# Patient Record
Sex: Female | Born: 1960 | Race: White | Hispanic: No | Marital: Single | Smoking: Never smoker
Health system: Southern US, Community
[De-identification: ages and names within clinical notes are randomized; demographics above are authoritative.]

## PROBLEM LIST (undated history)

## (undated) DIAGNOSIS — D649 Anemia, unspecified: Secondary | ICD-10-CM

## (undated) DIAGNOSIS — I82409 Acute embolism and thrombosis of unspecified deep veins of unspecified lower extremity: Secondary | ICD-10-CM

## (undated) HISTORY — DX: Anemia, unspecified: D64.9

## (undated) HISTORY — DX: Acute embolism and thrombosis of unspecified deep veins of unspecified lower extremity: I82.409

---

## 1999-10-21 ENCOUNTER — Encounter: Admission: RE | Admit: 1999-10-21 | Discharge: 1999-10-21 | Payer: Self-pay | Admitting: Family Medicine

## 1999-10-21 ENCOUNTER — Encounter: Payer: Self-pay | Admitting: Family Medicine

## 2000-11-26 ENCOUNTER — Encounter: Admission: RE | Admit: 2000-11-26 | Discharge: 2000-11-26 | Payer: Self-pay | Admitting: Family Medicine

## 2000-11-26 ENCOUNTER — Encounter: Payer: Self-pay | Admitting: Family Medicine

## 2000-12-01 ENCOUNTER — Encounter: Payer: Self-pay | Admitting: Family Medicine

## 2000-12-01 ENCOUNTER — Encounter: Admission: RE | Admit: 2000-12-01 | Discharge: 2000-12-01 | Payer: Self-pay | Admitting: Family Medicine

## 2001-12-02 ENCOUNTER — Encounter: Payer: Self-pay | Admitting: Family Medicine

## 2001-12-02 ENCOUNTER — Encounter: Admission: RE | Admit: 2001-12-02 | Discharge: 2001-12-02 | Payer: Self-pay | Admitting: Family Medicine

## 2002-02-02 HISTORY — PX: GASTRIC BYPASS: SHX52

## 2002-10-02 ENCOUNTER — Ambulatory Visit (HOSPITAL_COMMUNITY): Admission: RE | Admit: 2002-10-02 | Discharge: 2002-10-02 | Payer: Self-pay | Admitting: *Deleted

## 2002-10-02 ENCOUNTER — Encounter: Admission: RE | Admit: 2002-10-02 | Discharge: 2002-12-31 | Payer: Self-pay | Admitting: *Deleted

## 2002-10-03 ENCOUNTER — Encounter: Admission: RE | Admit: 2002-10-03 | Discharge: 2002-10-20 | Payer: Self-pay | Admitting: *Deleted

## 2002-10-06 ENCOUNTER — Ambulatory Visit (HOSPITAL_COMMUNITY): Admission: RE | Admit: 2002-10-06 | Discharge: 2002-10-06 | Payer: Self-pay | Admitting: *Deleted

## 2002-10-19 ENCOUNTER — Encounter (INDEPENDENT_AMBULATORY_CARE_PROVIDER_SITE_OTHER): Payer: Self-pay | Admitting: Specialist

## 2002-10-19 ENCOUNTER — Ambulatory Visit (HOSPITAL_COMMUNITY): Admission: RE | Admit: 2002-10-19 | Discharge: 2002-10-19 | Payer: Self-pay | Admitting: Gastroenterology

## 2002-10-26 ENCOUNTER — Encounter: Admission: RE | Admit: 2002-10-26 | Discharge: 2002-10-26 | Payer: Self-pay | Admitting: *Deleted

## 2002-12-01 ENCOUNTER — Encounter: Admission: RE | Admit: 2002-12-01 | Discharge: 2002-12-01 | Payer: Self-pay | Admitting: Family Medicine

## 2003-01-04 ENCOUNTER — Encounter: Admission: RE | Admit: 2003-01-04 | Discharge: 2003-04-04 | Payer: Self-pay | Admitting: *Deleted

## 2003-01-22 ENCOUNTER — Ambulatory Visit (HOSPITAL_COMMUNITY): Admission: RE | Admit: 2003-01-22 | Discharge: 2003-01-22 | Payer: Self-pay | Admitting: Vascular Surgery

## 2003-01-23 ENCOUNTER — Inpatient Hospital Stay (HOSPITAL_COMMUNITY): Admission: RE | Admit: 2003-01-23 | Discharge: 2003-01-26 | Payer: Self-pay | Admitting: General Surgery

## 2003-04-26 ENCOUNTER — Encounter: Admission: RE | Admit: 2003-04-26 | Discharge: 2003-07-25 | Payer: Self-pay | Admitting: *Deleted

## 2003-07-26 ENCOUNTER — Encounter: Admission: RE | Admit: 2003-07-26 | Discharge: 2003-10-24 | Payer: Self-pay | Admitting: *Deleted

## 2003-11-12 ENCOUNTER — Encounter: Admission: RE | Admit: 2003-11-12 | Discharge: 2003-11-12 | Payer: Self-pay | Admitting: Family Medicine

## 2004-11-17 ENCOUNTER — Ambulatory Visit (HOSPITAL_COMMUNITY): Admission: RE | Admit: 2004-11-17 | Discharge: 2004-11-17 | Payer: Self-pay | Admitting: Family Medicine

## 2005-11-20 ENCOUNTER — Encounter: Admission: RE | Admit: 2005-11-20 | Discharge: 2005-11-20 | Payer: Self-pay | Admitting: Family Medicine

## 2006-11-22 ENCOUNTER — Encounter: Admission: RE | Admit: 2006-11-22 | Discharge: 2006-11-22 | Payer: Self-pay | Admitting: Family Medicine

## 2007-11-25 ENCOUNTER — Encounter: Admission: RE | Admit: 2007-11-25 | Discharge: 2007-11-25 | Payer: Self-pay | Admitting: Family Medicine

## 2008-02-03 HISTORY — PX: HIP SURGERY: SHX245

## 2008-12-31 ENCOUNTER — Encounter: Admission: RE | Admit: 2008-12-31 | Discharge: 2008-12-31 | Payer: Self-pay | Admitting: Family Medicine

## 2009-03-25 ENCOUNTER — Other Ambulatory Visit: Admission: RE | Admit: 2009-03-25 | Discharge: 2009-03-25 | Payer: Self-pay | Admitting: Family Medicine

## 2009-11-04 ENCOUNTER — Encounter: Admission: RE | Admit: 2009-11-04 | Discharge: 2009-11-04 | Payer: Self-pay | Admitting: Family Medicine

## 2010-02-07 ENCOUNTER — Inpatient Hospital Stay (HOSPITAL_COMMUNITY)
Admission: RE | Admit: 2010-02-07 | Discharge: 2010-02-11 | Payer: Self-pay | Source: Home / Self Care | Attending: Orthopaedic Surgery | Admitting: Orthopaedic Surgery

## 2010-02-07 LAB — TYPE AND SCREEN
ABO/RH(D): A POS
Antibody Screen: NEGATIVE

## 2010-02-07 LAB — ABO/RH: ABO/RH(D): A POS

## 2010-02-07 LAB — PROTIME-INR
INR: 1.04 (ref 0.00–1.49)
Prothrombin Time: 13.8 seconds (ref 11.6–15.2)

## 2010-02-17 LAB — CBC
HCT: 30.3 % — ABNORMAL LOW (ref 36.0–46.0)
HCT: 27.8 % — ABNORMAL LOW (ref 36.0–46.0)
HCT: 27.4 % — ABNORMAL LOW (ref 36.0–46.0)
Hemoglobin: 9.9 g/dL — ABNORMAL LOW (ref 12.0–15.0)
Hemoglobin: 9.1 g/dL — ABNORMAL LOW (ref 12.0–15.0)
Hemoglobin: 8.8 g/dL — ABNORMAL LOW (ref 12.0–15.0)
MCH: 30.9 pg (ref 26.0–34.0)
MCH: 31.2 pg (ref 26.0–34.0)
MCH: 30.4 pg (ref 26.0–34.0)
MCHC: 32.7 g/dL (ref 30.0–36.0)
MCHC: 32.7 g/dL (ref 30.0–36.0)
MCHC: 32.1 g/dL (ref 30.0–36.0)
MCV: 94.7 fL (ref 78.0–100.0)
MCV: 95.2 fL (ref 78.0–100.0)
MCV: 94.8 fL (ref 78.0–100.0)
Platelets: 189 10*3/uL (ref 150–400)
Platelets: 169 10*3/uL (ref 150–400)
Platelets: 183 10*3/uL (ref 150–400)
RBC: 3.2 MIL/uL — ABNORMAL LOW (ref 3.87–5.11)
RBC: 2.92 MIL/uL — ABNORMAL LOW (ref 3.87–5.11)
RBC: 2.89 MIL/uL — ABNORMAL LOW (ref 3.87–5.11)
RDW: 12.9 % (ref 11.5–15.5)
RDW: 12.9 % (ref 11.5–15.5)
RDW: 12.9 % (ref 11.5–15.5)
WBC: 6.7 10*3/uL (ref 4.0–10.5)
WBC: 9.5 10*3/uL (ref 4.0–10.5)
WBC: 8.5 10*3/uL (ref 4.0–10.5)

## 2010-02-17 LAB — BASIC METABOLIC PANEL
BUN: 11 mg/dL (ref 6–23)
BUN: 6 mg/dL (ref 6–23)
BUN: 6 mg/dL (ref 6–23)
CO2: 28 mEq/L (ref 19–32)
CO2: 29 mEq/L (ref 19–32)
CO2: 28 mEq/L (ref 19–32)
Calcium: 8.4 mg/dL (ref 8.4–10.5)
Calcium: 8.5 mg/dL (ref 8.4–10.5)
Calcium: 8.3 mg/dL — ABNORMAL LOW (ref 8.4–10.5)
Chloride: 104 mEq/L (ref 96–112)
Chloride: 106 mEq/L (ref 96–112)
Chloride: 104 mEq/L (ref 96–112)
Creatinine, Ser: 0.76 mg/dL (ref 0.4–1.2)
Creatinine, Ser: 0.77 mg/dL (ref 0.4–1.2)
Creatinine, Ser: 0.72 mg/dL (ref 0.4–1.2)
GFR calc Af Amer: >60 mL/min (ref >60)
GFR calc Af Amer: >60 mL/min (ref >60)
GFR calc Af Amer: >60 mL/min (ref >60)
GFR calc non Af Amer: >60 mL/min (ref >60)
GFR calc non Af Amer: >60 mL/min (ref >60)
GFR calc non Af Amer: >60 mL/min (ref >60)
Glucose, Bld: 121 mg/dL — ABNORMAL HIGH (ref 70–99)
Glucose, Bld: 114 mg/dL — ABNORMAL HIGH (ref 70–99)
Glucose, Bld: 112 mg/dL — ABNORMAL HIGH (ref 70–99)
Potassium: 4.3 mEq/L (ref 3.5–5.1)
Potassium: 4.1 mEq/L (ref 3.5–5.1)
Potassium: 3.8 mEq/L (ref 3.5–5.1)
Sodium: 137 mEq/L (ref 135–145)
Sodium: 141 mEq/L (ref 135–145)
Sodium: 138 mEq/L (ref 135–145)

## 2010-02-17 LAB — PROTIME-INR
INR: 1.22 (ref 0.00–1.49)
INR: 1.67 — ABNORMAL HIGH (ref 0.00–1.49)
INR: 1.68 — ABNORMAL HIGH (ref 0.00–1.49)
INR: 1.54 — ABNORMAL HIGH (ref 0.00–1.49)
Prothrombin Time: 15.6 seconds — ABNORMAL HIGH (ref 11.6–15.2)
Prothrombin Time: 19.9 seconds — ABNORMAL HIGH (ref 11.6–15.2)
Prothrombin Time: 20 seconds — ABNORMAL HIGH (ref 11.6–15.2)
Prothrombin Time: 18.7 seconds — ABNORMAL HIGH (ref 11.6–15.2)

## 2010-02-17 LAB — GLUCOSE, CAPILLARY: Glucose-Capillary: 113 mg/dL — ABNORMAL HIGH (ref 70–99)

## 2010-02-22 ENCOUNTER — Encounter: Payer: Self-pay | Admitting: General Surgery

## 2010-03-04 ENCOUNTER — Inpatient Hospital Stay (HOSPITAL_COMMUNITY)
Admission: EM | Admit: 2010-03-04 | Discharge: 2010-03-08 | DRG: 176 | Disposition: A | Discharge status: Home Health | Payer: 59 | Attending: Internal Medicine | Admitting: Internal Medicine

## 2010-03-04 DIAGNOSIS — R5381 Other malaise: Secondary | ICD-10-CM | POA: Diagnosis present

## 2010-03-04 DIAGNOSIS — Z9221 Personal history of antineoplastic chemotherapy: Secondary | ICD-10-CM

## 2010-03-04 DIAGNOSIS — D259 Leiomyoma of uterus, unspecified: Secondary | ICD-10-CM | POA: Diagnosis present

## 2010-03-04 DIAGNOSIS — I82409 Acute embolism and thrombosis of unspecified deep veins of unspecified lower extremity: Secondary | ICD-10-CM | POA: Diagnosis present

## 2010-03-04 DIAGNOSIS — J984 Other disorders of lung: Secondary | ICD-10-CM | POA: Diagnosis present

## 2010-03-04 DIAGNOSIS — E669 Obesity, unspecified: Secondary | ICD-10-CM | POA: Diagnosis present

## 2010-03-04 DIAGNOSIS — Z96649 Presence of unspecified artificial hip joint: Secondary | ICD-10-CM

## 2010-03-04 DIAGNOSIS — D649 Anemia, unspecified: Secondary | ICD-10-CM | POA: Diagnosis present

## 2010-03-04 DIAGNOSIS — I2699 Other pulmonary embolism without acute cor pulmonale: Principal | ICD-10-CM | POA: Diagnosis present

## 2010-03-04 DIAGNOSIS — R5383 Other fatigue: Secondary | ICD-10-CM | POA: Diagnosis present

## 2010-03-04 DIAGNOSIS — M1990 Unspecified osteoarthritis, unspecified site: Secondary | ICD-10-CM | POA: Diagnosis present

## 2010-03-04 LAB — CBC
HCT: 33.8 % — ABNORMAL LOW (ref 36.0–46.0)
MCV: 94.9 fL (ref 78.0–100.0)
Platelets: 296 10*3/uL (ref 150–400)
RBC: 3.56 MIL/uL — ABNORMAL LOW (ref 3.87–5.11)
WBC: 9.9 10*3/uL (ref 4.0–10.5)

## 2010-03-04 LAB — DIFFERENTIAL
Lymphocytes Relative: 7 % — ABNORMAL LOW (ref 12–46)
Lymphs Abs: 0.7 10*3/uL (ref 0.7–4.0)
Neutrophils Relative %: 86 % — ABNORMAL HIGH (ref 43–77)

## 2010-03-04 LAB — BASIC METABOLIC PANEL
GFR calc non Af Amer: 49 mL/min — ABNORMAL LOW (ref >60)
Potassium: 4.7 mEq/L (ref 3.5–5.1)
Sodium: 137 mEq/L (ref 135–145)

## 2010-03-04 LAB — OCCULT BLOOD, POC DEVICE: Fecal Occult Bld: NEGATIVE

## 2010-03-05 DIAGNOSIS — I2699 Other pulmonary embolism without acute cor pulmonale: Secondary | ICD-10-CM

## 2010-03-05 LAB — COMPREHENSIVE METABOLIC PANEL
ALT: 13 U/L (ref 0–35)
AST: 15 U/L (ref 0–37)
Albumin: 3.3 g/dL — ABNORMAL LOW (ref 3.5–5.2)
Alkaline Phosphatase: 128 U/L — ABNORMAL HIGH (ref 39–117)
GFR calc Af Amer: >60 mL/min (ref >60)
Glucose, Bld: 111 mg/dL — ABNORMAL HIGH (ref 70–99)
Potassium: 4.5 mEq/L (ref 3.5–5.1)
Sodium: 140 mEq/L (ref 135–145)
Total Protein: 6.3 g/dL (ref 6.0–8.3)

## 2010-03-05 LAB — LUPUS ANTICOAGULANT PANEL
Lupus Anticoagulant: NOT DETECTED
PTT Lupus Anticoagulant: 35 secs (ref 30.0–45.6)

## 2010-03-05 LAB — CBC
Hemoglobin: 9.3 g/dL — ABNORMAL LOW (ref 12.0–15.0)
MCH: 30.2 pg (ref 26.0–34.0)
RBC: 3.08 MIL/uL — ABNORMAL LOW (ref 3.87–5.11)
WBC: 8.2 10*3/uL (ref 4.0–10.5)

## 2010-03-05 LAB — PROTEIN C ACTIVITY: Protein C Activity: 158 % — ABNORMAL HIGH (ref 75–133)

## 2010-03-05 LAB — CARDIOLIPIN ANTIBODIES, IGG, IGM, IGA
Anticardiolipin IgA: 1 APL U/mL — ABNORMAL LOW (ref <22)
Anticardiolipin IgG: 3 GPL U/mL — ABNORMAL LOW (ref <23)
Anticardiolipin IgM: 2 MPL U/mL — ABNORMAL LOW (ref <11)

## 2010-03-05 LAB — DIFFERENTIAL
Basophils Relative: 0 % (ref 0–1)
Lymphs Abs: 1.9 10*3/uL (ref 0.7–4.0)
Monocytes Relative: 13 % — ABNORMAL HIGH (ref 3–12)
Neutro Abs: 5.1 10*3/uL (ref 1.7–7.7)
Neutrophils Relative %: 63 % (ref 43–77)

## 2010-03-05 LAB — BETA-2-GLYCOPROTEIN I ABS, IGG/M/A: Beta-2-Glycoprotein I IgA: 3 A Units (ref <20)

## 2010-03-05 LAB — ANTITHROMBIN III: AntiThromb III Func: 131 % — ABNORMAL HIGH (ref 76–126)

## 2010-03-05 LAB — PROTEIN S ACTIVITY: Protein S Activity: 70 % (ref 69–129)

## 2010-03-05 LAB — PROTIME-INR
INR: 1.23 (ref 0.00–1.49)
Prothrombin Time: 15.7 seconds — ABNORMAL HIGH (ref 11.6–15.2)

## 2010-03-06 LAB — PROTIME-INR: Prothrombin Time: 16 seconds — ABNORMAL HIGH (ref 11.6–15.2)

## 2010-03-07 LAB — CBC
MCH: 29.9 pg (ref 26.0–34.0)
MCHC: 32 g/dL (ref 30.0–36.0)
MCV: 93.4 fL (ref 78.0–100.0)
Platelets: 183 10*3/uL (ref 150–400)
RDW: 14.1 % (ref 11.5–15.5)

## 2010-03-07 LAB — BASIC METABOLIC PANEL WITH GFR
BUN: 10 mg/dL (ref 6–23)
CO2: 26 meq/L (ref 19–32)
Calcium: 8.9 mg/dL (ref 8.4–10.5)
Chloride: 103 meq/L (ref 96–112)
Creatinine, Ser: 0.73 mg/dL (ref 0.4–1.2)
GFR calc non Af Amer: >60 mL/min
Glucose, Bld: 118 mg/dL — ABNORMAL HIGH (ref 70–99)
Potassium: 4.1 meq/L (ref 3.5–5.1)
Sodium: 137 meq/L (ref 135–145)

## 2010-03-07 LAB — PROTIME-INR
INR: 1.34 (ref 0.00–1.49)
Prothrombin Time: 16.8 s — ABNORMAL HIGH (ref 11.6–15.2)

## 2010-03-08 LAB — PROTIME-INR: INR: 1.52 — ABNORMAL HIGH (ref 0.00–1.49)

## 2010-03-10 LAB — FACTOR 5 LEIDEN

## 2010-03-11 NOTE — Discharge Summary (Signed)
NAME:  Rachel Yoder, Rachel Yoder              ACCOUNT NO.:  [id number]  MEDICAL RECORD NO.:  [id number]           PATIENT TYPE:  I  LOCATION:  1444                         FACILITY:  [facility]  PHYSICIAN:  Hillery Aldo, M.D.   DATE OF BIRTH:  [birth year]  DATE OF ADMISSION:  03/04/2010 DATE OF DISCHARGE:  03/08/2010                              DISCHARGE SUMMARY   PRIMARY CARE PHYSICIAN:  Duncan Dull, M.D.  DISCHARGE DIAGNOSES: 1. Subsegmental pulmonary embolus of the right lower lobe. 2. Deep vein thrombosis of the bilateral lower extremities. 3. Normocytic anemia, likely secondary to recent operative losses. 4. Generalized weakness. 5. Obesity: 6. Recent left total hip arthroplasty, on February 07, 2010. 7. Three millimeter right lower lobe subpleural nodule.  DISCHARGE MEDICATIONS: 1. Lovenox 140 mg subcutaneously b.i.d. 2. Warfarin 10 mg p.o. daily or as directed by PCP. 3. Calcium chews 500 mg OTC p.o. b.i.d. 4. Fiber tab 1 tablet OTC b.i.d. 5. Iron 27 mg OTC 1 tablet p.o. every other day. 6. Methocarbamol 500 mg p.o. q.6 h. p.r.n. muscle spasms. 7. Multivitamin 1 tablet p.o. daily. 8. Percocet 5/325 mg 1-2 tablets p.o. q.8-12 h. p.r.n. pain. 9. Vitamin B12 500 mcg p.o. every other day. 10.Vitamin D2 50,000 units 1 capsule p.o. every Wednesday.  Note:  The patient was instructed to discontinue aspirin while on Coumadin.  CONSULTATIONS:  None.  BRIEF ADMISSION HISTORY OF PRESENT ILLNESS:  The patient is a 50 year old female who recently underwent a left total hip arthroplasty by Dr. Doneen Poisson, on February 07, 2010.  She was placed on postoperative Coumadin and 1 week prior to presentation, was switched to aspirin alone for DVT prophylaxis.  She subsequently presented to the hospital with swelling of the lower extremities and complaints of dizziness and dyspnea.  Upon initial evaluation in the emergency department, a CT scan of the chest confirmed acute pulmonary  embolism and she was referred to the hospitalist service for inpatient evaluation.  For the full details, please see the dictated report done by Dr. Selena Batten.  PROCEDURES AND DIAGNOSTIC STUDIES: 1. CT angiography of the chest with contrast on March 04, 2010,     showed subsegmental embolus in the right lower lobe, which could be     acute with clot burden was felt to be small.  Three millimeter     right lower lobe subpleural nodule highly like to be benign, but     the patient was felt to be a high risk for bronchogenic carcinoma,     followup CT scan at 1-year recommended. 2. CT scan of the pelvis with contrast on March 04, 2010, showed a     suspected uterine fibroid.  No compelling evidence of pelvic DVT.     Early postoperative findings of the left hip without fracture or     complicating feature identified. 3. Lower-extremity Dopplers performed on March 05, 2010, showed     bilateral lower extremity DVT.  No evidence of Baker cyst.  DISCHARGE LABORATORY VALUES:  PT was 18.5 with an INR of 1.52.  Sodium was 137, potassium 4.1, chloride 103, bicarbonate 26, BUN 10, creatinine  0.73, glucose 118, calcium 8.9.  White blood cell count was 8.3, hemoglobin 9.1, hematocrit 28.4, platelets 183.  Hypercoagulable profile was obtained, but these findings are not considered accurate since Lovenox had been started.  Please see the chart for these results, but no obvious hypercoagulable state identified.  HOSPITAL COURSE BY PROBLEM: 1. PE/DVT:  This was a provoked event, but given that it is the     patient's second episode of venous thromboembolic disease, she may     be considered at sufficient high risk to warrant lifelong Coumadin.     This point, would treat her for 6 months and reevaluate with a     hypercoagulable panel while off all anticoagulation.  The patient's     clot burden was small and she had minimal respiratory symptoms     while in the hospital.  Because of the large clot  burden of the     lower extremities, the patient was put on restricted mobility and     monitored closely in the hospital on telemetry.  This felt that the     clots are likely stable now that she has been on therapeutic     anticoagulation with both Lovenox and Coumadin for 4 days.  The     patient's INR is still not therapeutic and therefore she will be     going home with Lovenox in addition to the Coumadin.  Home health     nurse will be sent to her home to draw her daily PT/INR and to fax     these results to her primary care physician for further dosage     adjustment of her Coumadin. 2. Normocytic anemia:  The patient's fecal occult blood testing was     negative.  Her preoperative hemoglobin was 13.3 so was felt that     her drop was due to operative losses from her total hip replacement     done on February 07, 2010.  We did place her on a prescription dose     iron, but she had a GI intolerance to this.  We will simply have     her resume her over-the-counter iron supplement at discharge. 3. Generalized weakness:  The patient did have generalized weakness     related to her lower extremity swelling, which delayed her     discharge because she lives alone.  This point, the patient is     ambulating adequately and has sufficient support at home to be     safely discharged. 4. Three millimeter right lower lobe subpleural nodule:  The patient     is not felt to be at high risk for bronchogenic carcinoma.  She     does not smoke.  She is not have a family history of lung cancer.  DISPOSITION:  The patient is medically stable and be discharged home.  CONDITION ON DISCHARGE:  Improved.  Time spent coordinating care for discharge and discharge instructions including face-to-face time equals 35 minutes.     Hillery Aldo, M.D.     CR/MEDQ  D:  03/08/2010  T:  03/09/2010  Job:  [id number]  cc:   Duncan Dull, M.D. Fax: [phone]  Electronically Signed by Hillery Aldo M.D.  on 03/11/2010 06:49:20 PM

## 2010-03-17 ENCOUNTER — Ambulatory Visit (HOSPITAL_COMMUNITY)
Admission: RE | Admit: 2010-03-17 | Discharge: 2010-03-17 | Disposition: A | Discharge status: Home / Self Care | Payer: 59 | Source: Ambulatory Visit | Attending: Family Medicine | Admitting: Family Medicine

## 2010-03-17 DIAGNOSIS — M7989 Other specified soft tissue disorders: Secondary | ICD-10-CM | POA: Insufficient documentation

## 2010-03-17 DIAGNOSIS — M79609 Pain in unspecified limb: Secondary | ICD-10-CM

## 2010-03-17 DIAGNOSIS — I824Y9 Acute embolism and thrombosis of unspecified deep veins of unspecified proximal lower extremity: Secondary | ICD-10-CM | POA: Insufficient documentation

## 2010-03-17 DIAGNOSIS — I82819 Embolism and thrombosis of superficial veins of unspecified lower extremities: Secondary | ICD-10-CM | POA: Insufficient documentation

## 2010-03-18 ENCOUNTER — Other Ambulatory Visit: Payer: Self-pay | Admitting: Vascular Surgery

## 2010-03-18 ENCOUNTER — Inpatient Hospital Stay (HOSPITAL_COMMUNITY)
Admission: AD | Admit: 2010-03-18 | Discharge: 2010-03-25 | DRG: 254 | Disposition: A | Discharge status: Home Health | Payer: 59 | Source: Ambulatory Visit | Attending: Vascular Surgery | Admitting: Vascular Surgery

## 2010-03-18 ENCOUNTER — Encounter (INDEPENDENT_AMBULATORY_CARE_PROVIDER_SITE_OTHER): Payer: 59 | Admitting: Vascular Surgery

## 2010-03-18 ENCOUNTER — Ambulatory Visit (HOSPITAL_COMMUNITY)
Admission: RE | Admit: 2010-03-18 | Discharge: 2010-03-18 | Disposition: A | Discharge status: Home / Self Care | Payer: 59 | Source: Ambulatory Visit | Attending: Vascular Surgery | Admitting: Vascular Surgery

## 2010-03-18 DIAGNOSIS — Z7901 Long term (current) use of anticoagulants: Secondary | ICD-10-CM

## 2010-03-18 DIAGNOSIS — Y838 Other surgical procedures as the cause of abnormal reaction of the patient, or of later complication, without mention of misadventure at the time of the procedure: Secondary | ICD-10-CM | POA: Insufficient documentation

## 2010-03-18 DIAGNOSIS — T82898A Other specified complication of vascular prosthetic devices, implants and grafts, initial encounter: Secondary | ICD-10-CM | POA: Insufficient documentation

## 2010-03-18 DIAGNOSIS — Z01812 Encounter for preprocedural laboratory examination: Secondary | ICD-10-CM

## 2010-03-18 DIAGNOSIS — I82409 Acute embolism and thrombosis of unspecified deep veins of unspecified lower extremity: Secondary | ICD-10-CM

## 2010-03-18 DIAGNOSIS — Z01818 Encounter for other preprocedural examination: Secondary | ICD-10-CM

## 2010-03-18 DIAGNOSIS — I824Z9 Acute embolism and thrombosis of unspecified deep veins of unspecified distal lower extremity: Secondary | ICD-10-CM

## 2010-03-18 DIAGNOSIS — M79609 Pain in unspecified limb: Secondary | ICD-10-CM | POA: Insufficient documentation

## 2010-03-18 DIAGNOSIS — D649 Anemia, unspecified: Secondary | ICD-10-CM | POA: Diagnosis present

## 2010-03-18 DIAGNOSIS — Z96649 Presence of unspecified artificial hip joint: Secondary | ICD-10-CM

## 2010-03-18 LAB — CBC
HCT: 28.7 % — ABNORMAL LOW (ref 36.0–46.0)
Platelets: 439 10*3/uL — ABNORMAL HIGH (ref 150–400)
Platelets: 325 10*3/uL (ref 150–400)
RBC: 3.12 MIL/uL — ABNORMAL LOW (ref 3.87–5.11)
RBC: 2.39 MIL/uL — ABNORMAL LOW (ref 3.87–5.11)
RDW: 14.5 % (ref 11.5–15.5)
WBC: 13 10*3/uL — ABNORMAL HIGH (ref 4.0–10.5)
WBC: 11.5 10*3/uL — ABNORMAL HIGH (ref 4.0–10.5)

## 2010-03-18 LAB — BASIC METABOLIC PANEL
CO2: 24 mEq/L (ref 19–32)
Chloride: 106 mEq/L (ref 96–112)
Creatinine, Ser: 0.78 mg/dL (ref 0.4–1.2)
GFR calc Af Amer: >60 mL/min (ref >60)
Sodium: 137 mEq/L (ref 135–145)

## 2010-03-18 LAB — POCT I-STAT, CHEM 8
BUN: 9 mg/dL (ref 6–23)
Hemoglobin: 9.5 g/dL — ABNORMAL LOW (ref 12.0–15.0)
Potassium: 4 mEq/L (ref 3.5–5.1)
Sodium: 137 mEq/L (ref 135–145)
TCO2: 26 mmol/L (ref 0–100)

## 2010-03-18 LAB — PROTIME-INR: INR: 2.68 — ABNORMAL HIGH (ref 0.00–1.49)

## 2010-03-18 LAB — FIBRINOGEN: Fibrinogen: 716 mg/dL — ABNORMAL HIGH (ref 204–475)

## 2010-03-18 MED ORDER — IOHEXOL 300 MG/ML  SOLN
150.0000 mL | Freq: Once | INTRAMUSCULAR | Status: AC | PRN
  Administered 2010-03-18: 70 mL via INTRAVENOUS

## 2010-03-18 NOTE — Consult Note (Signed)
NEW PATIENT CONSULTATION  Rachel Yoder, Rachel Yoder DOB:  [birth year]                                       03/18/2010 NFAOZ#:[id number]  Patient was admitted to [facility] today for treatment of her extensive left leg DVT.  She has a very complex history.  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  She did have a prior history of bariatric surgery in 2004 and had an inferior vena cava filter placement several days prior to this surgery due to her risk for DVT. This was placed by Dr. Cari Caraway in 2004, and she had a trapeze filter placed.  Recently she underwent left hip surgery on 02/07/2010. She had a complication of a DVT following this.  She had a CT angiogram when she was having some shortness of breath after presenting to the emergency department on January 31.  This showed a subsegmental embolus in her right lower lobe, could be acute or chronic.  She also underwent a venous duplex at that time which showed DVT in her left and right leg, more so on the left leg.  She was continued on Coumadin therapy after a several-day hospitalization from 01/31 to 02/04.  Over the past weekend, she developed increasing swelling and tightness over her left leg and saw Dr. Kevan Ny in her office and had a repeat ultrasound ordered.  This showed extension of her clot, now with a more dilated left femoral vein and clot extending as far proximally as possible.  She is seeing me for further discussion.  She did undergo imaging while in the hospital, and I have reviewed these.  She had a CT scan of her chest and pelvis. Unfortunately, she did not have a CT scan of her abdomen; therefore, evaluation of the vena caval filter was impossible.  PAST MEDICAL HISTORY:  Significant for recent hip replacement, gastric bypass surgery, back surgery, and bunion surgery on her feet.  SOCIAL HISTORY:  She is single.  She has no children.  She works in [employer].  She does not  drink or smoke cigarettes.  FAMILY HISTORY:  Significant for venous varicosities in her mother.  REVIEW OF SYSTEMS:  Positive for weight gain up to 310 pounds.  She is 6 feet 3 inches tall.  She has a loss of appetite and fever. VASCULAR:  Prior DVT. PULMONARY:  Shortness of breath.  Review of systems otherwise negative.  PHYSICAL EXAMINATION:  A well-developed white female in no acute distress.  Blood pressure is 99/66, heart rate 103, respirations 16. Her chest is clear bilaterally.  Her abdomen is obese.  Her left leg is noted for a well-healed incision over her anterior hip.  She does have palpable superficial thrombophlebitis in the veins over her anterior thigh.  She does have marked swelling in her entire left leg versus her right.  Neurologic shows no focal weakness or paresthesias.  Skin shows just erythema over her left anterior thigh.  IMPRESSION:  Propagation of clot with marked swelling in her left leg. I do not see any evidence of phlegmasia or threat and venous gangrene. I discussed this with the patient and have recommended that she undergo consideration for thrombolysis due to the marked swelling.  I explained that this hopefully would reduce her swelling in the short term and also reduce her risk for progressive venous hypertension  changes in the long- term.  I discussed this with Dr. Valentina Gu, interventional radiology at [facility].  She will go from our office this morning to be admitted at [facility] for attempted thrombolysis.  I did explain that at some point she will need interrogation of her vena caval filter with the question of her recent pulmonary embolus.    Larina Earthly, M.D.  TFE/MEDQ  D:  03/18/2010  T:  03/18/2010  Job:  [id number]  cc:   Duncan Dull, M.D. Vanita Panda. Magnus Ivan, M.D.

## 2010-03-19 ENCOUNTER — Inpatient Hospital Stay (HOSPITAL_COMMUNITY): Payer: 59

## 2010-03-19 ENCOUNTER — Encounter (HOSPITAL_COMMUNITY): Payer: Self-pay | Admitting: Radiology

## 2010-03-19 LAB — CBC
HCT: 25.8 % — ABNORMAL LOW (ref 36.0–46.0)
Hemoglobin: 8 g/dL — ABNORMAL LOW (ref 12.0–15.0)
MCH: 28.8 pg (ref 26.0–34.0)
MCHC: 31.4 g/dL (ref 30.0–36.0)
MCV: 91.5 fL (ref 78.0–100.0)
Platelets: 364 10*3/uL (ref 150–400)
RBC: 2.82 MIL/uL — ABNORMAL LOW (ref 3.87–5.11)
WBC: 13.7 10*3/uL — ABNORMAL HIGH (ref 4.0–10.5)

## 2010-03-19 LAB — URINALYSIS, ROUTINE W REFLEX MICROSCOPIC
Leukocytes, UA: NEGATIVE
Protein, ur: 100 mg/dL — AB
Urobilinogen, UA: 1 mg/dL (ref 0.0–1.0)

## 2010-03-19 LAB — CREATININE, SERUM: GFR calc Af Amer: >60 mL/min (ref >60)

## 2010-03-19 LAB — HEPARIN LEVEL (UNFRACTIONATED): Heparin Unfractionated: 1.03 IU/mL — ABNORMAL HIGH (ref 0.30–0.70)

## 2010-03-19 LAB — FIBRINOGEN: Fibrinogen: 635 mg/dL — ABNORMAL HIGH (ref 204–475)

## 2010-03-19 LAB — URINE MICROSCOPIC-ADD ON

## 2010-03-19 MED ORDER — IOHEXOL 300 MG/ML  SOLN
100.0000 mL | Freq: Once | INTRAMUSCULAR | Status: AC | PRN
  Administered 2010-03-19: 100 mL via INTRAVENOUS

## 2010-03-20 ENCOUNTER — Inpatient Hospital Stay (HOSPITAL_COMMUNITY): Payer: 59

## 2010-03-20 DIAGNOSIS — I824Z9 Acute embolism and thrombosis of unspecified deep veins of unspecified distal lower extremity: Secondary | ICD-10-CM

## 2010-03-20 LAB — CBC
HCT: 23.9 % — ABNORMAL LOW (ref 36.0–46.0)
Hemoglobin: 7.5 g/dL — ABNORMAL LOW (ref 12.0–15.0)
MCH: 29.8 pg (ref 26.0–34.0)
MCH: 28.6 pg (ref 26.0–34.0)
MCHC: 32.3 g/dL (ref 30.0–36.0)
MCHC: 31.4 g/dL (ref 30.0–36.0)
MCV: 92.2 fL (ref 78.0–100.0)
Platelets: 294 10*3/uL (ref 150–400)
RBC: 2.55 MIL/uL — ABNORMAL LOW (ref 3.87–5.11)
RDW: 14.8 % (ref 11.5–15.5)

## 2010-03-20 LAB — GLUCOSE, CAPILLARY: Glucose-Capillary: 150 mg/dL — ABNORMAL HIGH (ref 70–99)

## 2010-03-20 MED ORDER — IOHEXOL 300 MG/ML  SOLN: 110.0000 mL | Freq: Once | INTRAMUSCULAR | Status: AC | PRN

## 2010-03-21 DIAGNOSIS — I824Z9 Acute embolism and thrombosis of unspecified deep veins of unspecified distal lower extremity: Secondary | ICD-10-CM

## 2010-03-21 LAB — CBC
HCT: 23.1 % — ABNORMAL LOW (ref 36.0–46.0)
Hemoglobin: 7.3 g/dL — ABNORMAL LOW (ref 12.0–15.0)
MCV: 89.5 fL (ref 78.0–100.0)
WBC: 7.4 10*3/uL (ref 4.0–10.5)

## 2010-03-21 LAB — HEPARIN LEVEL (UNFRACTIONATED)
Heparin Unfractionated: <0.1 IU/mL — ABNORMAL LOW (ref 0.30–0.70)
Heparin Unfractionated: 0.14 IU/mL — ABNORMAL LOW (ref 0.30–0.70)

## 2010-03-22 DIAGNOSIS — I824Z9 Acute embolism and thrombosis of unspecified deep veins of unspecified distal lower extremity: Secondary | ICD-10-CM

## 2010-03-22 LAB — CBC
HCT: 23.5 % — ABNORMAL LOW (ref 36.0–46.0)
Hemoglobin: 7.5 g/dL — ABNORMAL LOW (ref 12.0–15.0)
RBC: 2.59 MIL/uL — ABNORMAL LOW (ref 3.87–5.11)
WBC: 6 10*3/uL (ref 4.0–10.5)

## 2010-03-22 LAB — PROTIME-INR
INR: 2.44 — ABNORMAL HIGH (ref 0.00–1.49)
Prothrombin Time: 26.6 seconds — ABNORMAL HIGH (ref 11.6–15.2)

## 2010-03-22 LAB — HEPARIN LEVEL (UNFRACTIONATED)
Heparin Unfractionated: 0.21 IU/mL — ABNORMAL LOW (ref 0.30–0.70)
Heparin Unfractionated: 0.39 IU/mL (ref 0.30–0.70)

## 2010-03-23 DIAGNOSIS — I824Z9 Acute embolism and thrombosis of unspecified deep veins of unspecified distal lower extremity: Secondary | ICD-10-CM

## 2010-03-23 LAB — CROSSMATCH: Unit division: 0

## 2010-03-23 LAB — CBC
MCH: 28.1 pg (ref 26.0–34.0)
MCHC: 30.5 g/dL (ref 30.0–36.0)
MCV: 92.2 fL (ref 78.0–100.0)
Platelets: 446 10*3/uL — ABNORMAL HIGH (ref 150–400)
RBC: 2.7 MIL/uL — ABNORMAL LOW (ref 3.87–5.11)
RDW: 14.9 % (ref 11.5–15.5)

## 2010-03-23 LAB — HEPARIN LEVEL (UNFRACTIONATED): Heparin Unfractionated: 0.45 IU/mL (ref 0.30–0.70)

## 2010-03-24 DIAGNOSIS — I824Z9 Acute embolism and thrombosis of unspecified deep veins of unspecified distal lower extremity: Secondary | ICD-10-CM

## 2010-03-24 LAB — CBC
HCT: 28.5 % — ABNORMAL LOW (ref 36.0–46.0)
MCHC: 30.5 g/dL (ref 30.0–36.0)
Platelets: 526 10*3/uL — ABNORMAL HIGH (ref 150–400)
RDW: 15.1 % (ref 11.5–15.5)

## 2010-03-24 LAB — PROTIME-INR: Prothrombin Time: 30.2 seconds — ABNORMAL HIGH (ref 11.6–15.2)

## 2010-03-25 DIAGNOSIS — I824Z9 Acute embolism and thrombosis of unspecified deep veins of unspecified distal lower extremity: Secondary | ICD-10-CM

## 2010-03-25 LAB — CBC
MCH: 29 pg (ref 26.0–34.0)
Platelets: 435 10*3/uL — ABNORMAL HIGH (ref 150–400)
RBC: 2.72 MIL/uL — ABNORMAL LOW (ref 3.87–5.11)
WBC: 6.8 10*3/uL (ref 4.0–10.5)

## 2010-03-25 LAB — HEPARIN LEVEL (UNFRACTIONATED): Heparin Unfractionated: 0.66 IU/mL (ref 0.30–0.70)

## 2010-03-25 LAB — PROTIME-INR: INR: 3.3 — ABNORMAL HIGH (ref 0.00–1.49)

## 2010-03-26 NOTE — H&P (Signed)
NAME:  Rachel Yoder, Rachel Yoder NO.:  [id number]  MEDICAL RECORD NO.:  [id number]          PATIENT TYPE:  INP  LOCATION:  0102                         FACILITY:  [facility]  PHYSICIAN:  Massie Maroon, MD        DATE OF BIRTH:  April 07, 1960  DATE OF ADMISSION:  03/04/2010 DATE OF DISCHARGE:                             HISTORY & PHYSICAL   PRIMARY CARE PHYSICIAN:  Dr. Sima Matas.  CHIEF COMPLAINT:  "I am dizzy and short of breath."  HISTORY OF PRESENT ILLNESS:  [age]-year-old female with a history of prior right lower extremity DVT, apparently, recently had left total hip arthroplasty through a direct anterior approach on February 07, 2010. She apparently went off Coumadin last week and was placed on aspirin alone for DVT prophylaxis.  Today, when she woke, she felt dizzy and short of breath.  Patient denied any fever, chills, chest pain, palpitations, nausea, vomiting, bright red blood per rectum or black stool.  She presented to the ED.  A CT angio chest showed subsegmental embolus in the right lower lung, could be acute, but the clot burden is small.  There was also a 3 mm right lower lung subpleural nodule highly likely to be benign, but if the patient is at high risk, a followup CT chest 1 year is recommended.  Patient will be admitted for pulmonary embolus.  Patient was started on Lovenox before I arrived and therefore we will send off a hypercoagulable panel, but this will obviously be affected by her receiving Lovenox already.  Patient will be admitted for PE.  PAST MEDICAL HISTORY: 1. Right lower extremity DVT. 2. Uterine fibroids.  PAST SURGICAL HISTORY: 1. On October 19, 2002 EGD with biopsy - gastritis with three small     erosions present in the antrum, which are suspected to be secondary     to NSAID use and the few small gastric polyps, which were benign     fundic gland polyps. 2. On January 30, 2003, placement of IVC filter. 3. On January 23, 2003,  EGD. 4. On January 23, 2003, laparoscopic Roux-Y gastric bypass by Dr.     Glenna Fellows. 5. On February 07, 2010, left total hip arthroplasty with a direct     anterior approach by Dr. Doneen Poisson. 6. Bunionectomy 7. Back surgery in [place].  SOCIAL HISTORY:  Patient does not smoke.  She is single.  She has three cats.  She occasionally drinks.  FAMILY HISTORY:  Positive for colon cancer in her mother, who is alive at age 13.  Her father is alive with a history of lymphoma at age 64.  ALLERGIES:  No known drug allergies.  MEDICATIONS: 1. Methocarbamol 5 mg p.o. q.6h. p.r.n. muscle spasms. 2. Percocet 5/325 one to two p.o. q.8-12h. p.r.n. 3. Fiber tab OTC one p.o. b.i.d. 4. Vitamin D 50,000 international units one p.o. every week. 5. Iron 27 mg OTC one p.o. daily. 6. Vitamin B12 500 mcg p.o. daily. 7. Multivitamin one p.o. daily. 8. Calcium chews 500 mg one p.o. b.i.d. 9. Enteric-coated aspirin 325 mg p.o. daily.  REVIEW OF SYSTEMS:  Negative  for all 10 organ systems except for pertinent positives stated above.PHYSICAL EXAMINATION:  VITAL SIGNS:  Temperature 98.4, heart rate 70, blood pressure 114/71, pulse ox is 98% on room air. HEENT:  Anicteric. NECK:  No JVD. HEART:  Regular rate and rhythm.  S1, S2.  No murmurs, gallops or rubs. LUNGS:  Clear to auscultation bilaterally. ABDOMEN:  Soft, nontender, morbidly obese.  Positive bowel sounds. EXTREMITIES:  No cyanosis, clubbing or edema. SKIN:  Positive varicose veins.  Negative Homans' sign. LYMPH NODES:  No adenopathy. NEUROLOGIC EXAMINATION:  Nonfocal.  Cranial nerves II through XII intact.  Reflexes 2+, symmetric, diffuse with downgoing toes bilaterally, motor strength 5/5 in all four extremities, pinprick intact.  LABORATORY DATA:  Hemoccult stool negative.  Sodium 137, potassium 4.7, BUN 19, creatinine 1.16.  WBC 9.9, hemoglobin 10.7, platelet count 296.  DIAGNOSTIC STUDIES:  CTA chest shows subsegmental  embolus in the right lower lung and a 3 mm right lower lung subpleural nodule highly likely to be benign; however, patient is at high risk for bronchogenic carcinoma.  Repeat CT scan in a.m.  ASSESSMENT AND PLAN: 1. Pulmonary embolus:  Check lower extremity ultrasound to rule out     deep venous thrombosis.  It is good that she has an inferior vena     cava filter in place, so we have to stop anticoagulation due to     anemia.  Patient will be started on Lovenox 1 mg/kg subcutaneously     b.i.d. and transitioned to Coumadin.  Hypercoagulable panel is sent     since this is her second deep venous thrombosis.  She did have     reasons for her first deep venous thrombosis, which was the     bunionectomy and now left total hip arthroplasty as a contributing     factor to her second deep venous thrombosis.  Patient denies any     family history of blood clots. 2. The 3 mm right lower lung subpleural nodule:  Patient was told     about this and told to possibly have repeat computed tomography     chest in 1 year. 3. Osteoarthritis, left total hip arthroplasty through direct anterior     approach on February 07, 2010, physical therapy, occupational     therapy, Dilaudid, Percocet as needed. 4. Anemia:  Continue iron.     Massie Maroon, MD     JYK/MEDQ  D:  03/04/2010  T:  03/04/2010  Job:  [id number]  cc:   Vanita Panda. Magnus Ivan, M.D. Fax: [phone]  Lupita Leash M.D. [facility]  Electronically Signed by Pearson Grippe MD on 03/26/2010 01:28:26 PM

## 2010-03-28 ENCOUNTER — Other Ambulatory Visit: Payer: Self-pay | Admitting: Family Medicine

## 2010-03-28 ENCOUNTER — Other Ambulatory Visit (HOSPITAL_COMMUNITY)
Admission: RE | Admit: 2010-03-28 | Discharge: 2010-03-28 | Disposition: A | Discharge status: Home / Self Care | Payer: 59 | Source: Ambulatory Visit | Attending: Family Medicine | Admitting: Family Medicine

## 2010-03-28 DIAGNOSIS — Z124 Encounter for screening for malignant neoplasm of cervix: Secondary | ICD-10-CM | POA: Insufficient documentation

## 2010-03-30 ENCOUNTER — Emergency Department (HOSPITAL_BASED_OUTPATIENT_CLINIC_OR_DEPARTMENT_OTHER)
Admission: EM | Admit: 2010-03-30 | Discharge: 2010-03-30 | Disposition: A | Discharge status: Home / Self Care | Payer: 59 | Attending: Emergency Medicine | Admitting: Emergency Medicine

## 2010-03-30 ENCOUNTER — Emergency Department (INDEPENDENT_AMBULATORY_CARE_PROVIDER_SITE_OTHER): Payer: 59

## 2010-03-30 DIAGNOSIS — Z86718 Personal history of other venous thrombosis and embolism: Secondary | ICD-10-CM | POA: Insufficient documentation

## 2010-03-30 DIAGNOSIS — M25519 Pain in unspecified shoulder: Secondary | ICD-10-CM | POA: Insufficient documentation

## 2010-03-30 DIAGNOSIS — M542 Cervicalgia: Secondary | ICD-10-CM

## 2010-03-30 DIAGNOSIS — R209 Unspecified disturbances of skin sensation: Secondary | ICD-10-CM | POA: Insufficient documentation

## 2010-03-30 DIAGNOSIS — R0602 Shortness of breath: Secondary | ICD-10-CM | POA: Insufficient documentation

## 2010-03-30 LAB — BASIC METABOLIC PANEL
BUN: 11 mg/dL (ref 6–23)
Calcium: 9.4 mg/dL (ref 8.4–10.5)
Creatinine, Ser: 0.7 mg/dL (ref 0.4–1.2)
GFR calc non Af Amer: >60 mL/min (ref >60)
Glucose, Bld: 112 mg/dL — ABNORMAL HIGH (ref 70–99)
Sodium: 142 mEq/L (ref 135–145)

## 2010-03-30 LAB — DIFFERENTIAL
Basophils Relative: 0 % (ref 0–1)
Eosinophils Absolute: 0.2 10*3/uL (ref 0.0–0.7)
Eosinophils Relative: 2 % (ref 0–5)
Monocytes Relative: 10 % (ref 3–12)
Neutrophils Relative %: 60 % (ref 43–77)

## 2010-03-30 LAB — CBC
MCH: 28.2 pg (ref 26.0–34.0)
Platelets: 575 10*3/uL — ABNORMAL HIGH (ref 150–400)
RBC: 3.79 MIL/uL — ABNORMAL LOW (ref 3.87–5.11)
RDW: 15.3 % (ref 11.5–15.5)

## 2010-03-30 LAB — PROTIME-INR: Prothrombin Time: 19.1 seconds — ABNORMAL HIGH (ref 11.6–15.2)

## 2010-03-30 MED ORDER — IOHEXOL 350 MG/ML SOLN
100.0000 mL | Freq: Once | INTRAVENOUS | Status: AC | PRN
  Administered 2010-03-30: 100 mL via INTRAVENOUS

## 2010-04-02 ENCOUNTER — Encounter (HOSPITAL_BASED_OUTPATIENT_CLINIC_OR_DEPARTMENT_OTHER): Payer: 59 | Admitting: Oncology

## 2010-04-02 ENCOUNTER — Other Ambulatory Visit: Payer: Self-pay | Admitting: Oncology

## 2010-04-02 ENCOUNTER — Encounter: Payer: 59 | Admitting: Oncology

## 2010-04-02 DIAGNOSIS — I82409 Acute embolism and thrombosis of unspecified deep veins of unspecified lower extremity: Secondary | ICD-10-CM

## 2010-04-02 LAB — IRON AND TIBC
%SAT: 11 % — ABNORMAL LOW (ref 20–55)
TIBC: 352 ug/dL (ref 250–470)
UIBC: 312 ug/dL

## 2010-04-02 LAB — CBC WITH DIFFERENTIAL/PLATELET
Basophils Absolute: 0 10*3/uL (ref 0.0–0.1)
EOS%: 1.3 % (ref 0.0–7.0)
Eosinophils Absolute: 0.1 10*3/uL (ref 0.0–0.5)
HCT: 34.8 % (ref 34.8–46.6)
HGB: 11.1 g/dL — ABNORMAL LOW (ref 11.6–15.9)
MCH: 28.6 pg (ref 25.1–34.0)
MCV: 89.1 fL (ref 79.5–101.0)
MONO%: 8.9 % (ref 0.0–14.0)
NEUT#: 4.1 10*3/uL (ref 1.5–6.5)
NEUT%: 64.8 % (ref 38.4–76.8)
Platelets: 591 10*3/uL — ABNORMAL HIGH (ref 145–400)

## 2010-04-02 LAB — VITAMIN B12: Vitamin B-12: 618 pg/mL (ref 211–911)

## 2010-04-02 LAB — FOLATE: Folate: 10.9 ng/mL

## 2010-04-05 LAB — PNH PROFILE (-HIGH SENSITIVITY)
Number of markers:: 9
Viability (%): 82 %

## 2010-04-07 ENCOUNTER — Encounter (HOSPITAL_BASED_OUTPATIENT_CLINIC_OR_DEPARTMENT_OTHER): Payer: 59 | Admitting: Oncology

## 2010-04-07 ENCOUNTER — Other Ambulatory Visit: Payer: Self-pay | Admitting: Oncology

## 2010-04-07 DIAGNOSIS — I82409 Acute embolism and thrombosis of unspecified deep veins of unspecified lower extremity: Secondary | ICD-10-CM

## 2010-04-07 DIAGNOSIS — D649 Anemia, unspecified: Secondary | ICD-10-CM

## 2010-04-07 DIAGNOSIS — D473 Essential (hemorrhagic) thrombocythemia: Secondary | ICD-10-CM

## 2010-04-07 LAB — CBC WITH DIFFERENTIAL/PLATELET
BASO%: 2.1 % — ABNORMAL HIGH (ref 0.0–2.0)
Basophils Absolute: 0.1 10*3/uL (ref 0.0–0.1)
EOS%: 1.7 % (ref 0.0–7.0)
Eosinophils Absolute: 0.1 10*3/uL (ref 0.0–0.5)
HCT: 35 % (ref 34.8–46.6)
HGB: 11.4 g/dL — ABNORMAL LOW (ref 11.6–15.9)
LYMPH%: 28.4 % (ref 14.0–49.7)
MCH: 28.7 pg (ref 25.1–34.0)
MCHC: 32.5 g/dL (ref 31.5–36.0)
MCV: 88.3 fL (ref 79.5–101.0)
MONO#: 0.4 10*3/uL (ref 0.1–0.9)
MONO%: 8.9 % (ref 0.0–14.0)
NEUT#: 2.8 10*3/uL (ref 1.5–6.5)
NEUT%: 58.9 % (ref 38.4–76.8)
Platelets: 399 10*3/uL (ref 145–400)
RBC: 3.97 10*6/uL (ref 3.70–5.45)
RDW: 15.8 % — ABNORMAL HIGH (ref 11.2–14.5)
WBC: 4.7 10*3/uL (ref 3.9–10.3)
lymph#: 1.3 10*3/uL (ref 0.9–3.3)

## 2010-04-07 LAB — JAK-2 V617F

## 2010-04-07 LAB — PROTIME-INR
INR: 2.1 (ref 2.00–3.50)
Protime: 25.2 Seconds — ABNORMAL HIGH (ref 10.6–13.4)

## 2010-04-08 NOTE — Discharge Summary (Addendum)
NAMEMarland Kitchen  Rachel Yoder, Rachel Yoder              ACCOUNT NO.:  [id number]  MEDICAL RECORD NO.:  [id number]           PATIENT TYPE:  I  LOCATION:  2030                         FACILITY:  [facility]  PHYSICIAN:  Larina Earthly, M.D.    DATE OF BIRTH:  1960-04-17  DATE OF ADMISSION:  03/18/2010 DATE OF DISCHARGE:                              DISCHARGE SUMMARY   ADMIT DIAGNOSIS:  Bilateral lower extremity and iliac vein deep venous thrombosis.  PAST MEDICAL HISTORY AND DISCHARGE DIAGNOSES: 1. Bilateral lower extremity and iliac deep venous thrombosis, status     post lysis; heparin and Coumadin therapy. 2. Uterine fibroids. 3. History of gastritis, status post esophagogastroduodenoscopy in     2004. 4. Inferior vena cava filter in 2004. 5. Roux-en-Y gastric bypass in 2004. 6. Left total hip arthroplasty in 2012. 7. Bunionectomy. 8. Back surgery. 9. Anemia.  ALLERGIES:  No known drug allergies.  BRIEF HISTORY:  The patient is a 50 year old female who was evaluated by Dr. Arbie Cookey in the office on March 18, 2010, for known bilateral lower extremity DVTs.  The patient had a complex history with DVT dating back to the 1990s.  She had a gastric bypass performed in 2004 and had an IVC filter placed several days prior to this surgery prophylactically.  The patient recently underwent a left hip surgery on February 07, 2010, which was complicated by DVT postoperatively.  She had a CT angiogram after having presented to the ER on March 04, 2010, with complaints of shortness of breath.  This showed a subsegmental embolus in the right lower lobe, which could be acute or chronic.  Venous duplex at that time showed DVT in the bilateral lower extremities more so on the left.  She had been continued on Coumadin therapy after a several-day hospitalization from March 03, 2010 to March 08, 2010.  Subsequent to that, she developed increased swelling and tightness in the left lower extremity and saw Dr. Kevan Ny as  an outpatient.  A repeat ultrasound was ordered, which showed extension of clot with a more dilated left femoral vein and clot extending as far proximally as possible.  After evaluation by Dr. Arbie Cookey in the office, he felt the patient should be admitted for lysis by Interventional Radiology secondary to propagation of clot despite Coumadin therapy.  HOSPITAL COURSE:  The patient was admitted and taken to Interventional Radiology on March 18, 2010, for placement of catheters for thrombolysis.  The patient was monitored closely and returned to Interventional Radiology on several occasions for repeat studies to follow progress.  On March 20, 2010, the patient was returned to Interventional Radiology, at which time, the bilateral lower extremity venogram and IVC gram revealed near complete clearance of the bilateral fem-pop system.  There was residual nonocclusive thrombus in the bilateral iliac veins with minimal residual clot in the filter.  There was good flow to the IVC filter at that time.  The lytics were discontinued and the sheaths were taken out.  The patient was continued on heparin therapy throughout the remainder of the hospital course.  She was then started on Coumadin on March 22, 2010.  She has ambulated well in the hall and throughout the hospital course her significant edema in the left lower extremity has continued to decrease.    On March 24, 2010, the patient feels well.  She is ambulating well in the halls.  She is afebrile with stable vital signs.  The left lower extremity edema is significantly reduced, although she does still have some enlargement in the thigh, but it is much softer.  Bilateral lower extremities are warm.  The right lower extremity reveals minimal edema. The patient will continue on heparin tonight.  If she remains stable, he heparin will be discontinued on 03/25/10 and she will be ready for discharge home on p.o.  Coumadin only.  LABORATORY DATA:  CBC on March 24, 2010; white count 7.2, hemoglobin 8.7, hematocrit 28.5, platelets 526.  INR 2.88.  The patient received specific written discharge instructions regarding diet activity and wound care.  She is to continue to ambulate multiple times a day.  She will follow with Dr. Arbie Cookey in approximately 1 month with bilateral lower extremity venous duplex to evaluate her status. The patient was already established with [facility] home [facility].  She will have [facility] draw a PT and INR on Friday, March 28, 2010 which will be called to Dr. Shaune Pollack who will follow and regulate her Coumadin therapy.  DISCHARGE MEDICATIONS: 1. Coumadin 5 mg p.o. daily.  This will be regulated by Dr. Kevan Ny     pending followup INRs. 2. Caffeine juice 500 mg OTC one b.i.d. 3. Fibertab OTC b.i.d. 4. Iron 27 mg OTC every other day. 5. Vitamin B12 500 mcg p.o. every other day. 6. Vitamin D2 50,000 units 1 p.o. q. week. 7. Oxycodone 5 mg one to two q.4-6 h. p.r.n.     Pecola Leisure, PA   ______________________________ Larina Earthly, M.D.    AY/MEDQ  D:  03/24/2010  T:  03/25/2010  Job:  [id number]  Electronically Signed by Pecola Leisure PA on 03/25/2010 01:16:03 PM Electronically Signed by Tawanna Cooler Kelden Lavallee M.D. on 04/08/2010 09:59:52 AM Electronically Signed by Shanna Strength M.D. on 04/08/2010 10:09:45 AM Electronically Signed by Tawanna Cooler Pearlena Ow M.D. on 04/08/2010 10:18:40 AM Electronically Signed by Tawanna Cooler Nymir Ringler M.D. on 04/08/2010 10:27:50 AM Electronically Signed by Tawanna Cooler Nastassia Bazaldua M.D. on 04/08/2010 10:36:47 AM Electronically Signed by Tawanna Cooler Elmo Shumard M.D. on 04/08/2010 10:46:00 AM Electronically Signed by Tawanna Cooler Thaddeus Evitts M.D. on 04/08/2010 10:56:05 AM Electronically Signed by Tawanna Cooler Maddalena Linarez M.D. on 04/08/2010 11:06:08 AM Electronically Signed by Tawanna Cooler Eureka Valdes M.D. on 04/08/2010 11:16:28 AM Electronically Signed by Tawanna Cooler Kenly Henckel M.D. on 04/08/2010 11:28:48 AM Electronically Signed by Tawanna Cooler Donnalee Cellucci  M.D. on 04/08/2010 11:42:11 AM Electronically Signed by Tawanna Cooler Hubert Raatz M.D. on 04/08/2010 11:55:44 AM Electronically Signed by Tawanna Cooler Jackilyn Umphlett M.D. on 04/08/2010 12:10:12 PM Electronically Signed by Tawanna Cooler Germaine Shenker M.D. on 04/08/2010 12:25:46 PM Electronically Signed by Tawanna Cooler Adaora Mchaney M.D. on 04/08/2010 12:42:28 PM Electronically Signed by Tawanna Cooler Sheala Dosh M.D. on 04/08/2010 01:00:38 PM Electronically Signed by Tawanna Cooler Alvine Mostafa M.D. on 04/08/2010 01:20:09 PM Electronically Signed by Tawanna Cooler Malani Lees M.D. on 04/08/2010 01:41:44 PM Electronically Signed by Tawanna Cooler Cherylin Waguespack M.D. on 04/08/2010 02:01:40 PM Electronically Signed by Tawanna Cooler Luann Aspinwall M.D. on 04/08/2010 02:21:55 PM Electronically Signed by Tawanna Cooler Jennifer Holland M.D. on 04/08/2010 02:44:25 PM Electronically Signed by Tawanna Cooler Kalisa Girtman M.D. on 04/08/2010 03:07:54 PM Electronically Signed by Tawanna Cooler Aceson Labell M.D. on 04/08/2010 03:32:45 PM Electronically Signed by Tawanna Cooler Teriann Livingood M.D. on 04/08/2010 04:07:19 PM Electronically Signed by Tawanna Cooler Cristy Colmenares M.D. on 04/08/2010 04:38:41 PM Electronically Signed by Tiombe Tomeo M.D. on 04/08/2010  05:10:18 PM Electronically Signed by Tawanna Cooler Ezekeil Bethel M.D. on 04/08/2010 05:10:18 PM Electronically Signed by Tawanna Cooler Azul Coffie M.D. on 04/08/2010 05:39:20 PM Electronically Signed by Tawanna Cooler Ajna Moors M.D. on 04/08/2010 16:10:96 PM Electronically Signed by Leeanne Butters M.D. on 04/08/2010 07:32:31 PM

## 2010-04-14 ENCOUNTER — Other Ambulatory Visit: Payer: Self-pay | Admitting: Oncology

## 2010-04-14 ENCOUNTER — Encounter (HOSPITAL_BASED_OUTPATIENT_CLINIC_OR_DEPARTMENT_OTHER): Payer: 59 | Admitting: Oncology

## 2010-04-14 DIAGNOSIS — D473 Essential (hemorrhagic) thrombocythemia: Secondary | ICD-10-CM

## 2010-04-14 DIAGNOSIS — Z7901 Long term (current) use of anticoagulants: Secondary | ICD-10-CM

## 2010-04-14 DIAGNOSIS — I82409 Acute embolism and thrombosis of unspecified deep veins of unspecified lower extremity: Secondary | ICD-10-CM

## 2010-04-14 DIAGNOSIS — D649 Anemia, unspecified: Secondary | ICD-10-CM

## 2010-04-14 LAB — PROTIME-INR: Protime: 22.8 Seconds — ABNORMAL HIGH (ref 10.6–13.4)

## 2010-04-14 LAB — BASIC METABOLIC PANEL
BUN: 13 mg/dL (ref 6–23)
CO2: 28 mEq/L (ref 19–32)
Calcium: 9.6 mg/dL (ref 8.4–10.5)
GFR calc non Af Amer: >60 mL/min (ref >60)
Glucose, Bld: 100 mg/dL — ABNORMAL HIGH (ref 70–99)
Sodium: 140 mEq/L (ref 135–145)

## 2010-04-14 LAB — URINALYSIS, ROUTINE W REFLEX MICROSCOPIC
Glucose, UA: NEGATIVE mg/dL
Ketones, ur: NEGATIVE mg/dL
Nitrite: NEGATIVE
Protein, ur: NEGATIVE mg/dL
Urobilinogen, UA: 0.2 mg/dL (ref 0.0–1.0)

## 2010-04-14 LAB — CBC
Hemoglobin: 13.3 g/dL (ref 12.0–15.0)
MCH: 31.3 pg (ref 26.0–34.0)
MCHC: 32.8 g/dL (ref 30.0–36.0)
RDW: 12.9 % (ref 11.5–15.5)

## 2010-04-14 LAB — SURGICAL PCR SCREEN: Staphylococcus aureus: NEGATIVE

## 2010-04-18 ENCOUNTER — Other Ambulatory Visit: Payer: Self-pay | Admitting: Oncology

## 2010-04-18 ENCOUNTER — Encounter (HOSPITAL_BASED_OUTPATIENT_CLINIC_OR_DEPARTMENT_OTHER): Payer: 59 | Admitting: Oncology

## 2010-04-18 DIAGNOSIS — Z7901 Long term (current) use of anticoagulants: Secondary | ICD-10-CM

## 2010-04-18 DIAGNOSIS — D649 Anemia, unspecified: Secondary | ICD-10-CM

## 2010-04-18 DIAGNOSIS — I82409 Acute embolism and thrombosis of unspecified deep veins of unspecified lower extremity: Secondary | ICD-10-CM

## 2010-04-18 DIAGNOSIS — D473 Essential (hemorrhagic) thrombocythemia: Secondary | ICD-10-CM

## 2010-04-18 LAB — PROTIME-INR: INR: 2.4 (ref 2.00–3.50)

## 2010-04-18 LAB — CBC WITH DIFFERENTIAL/PLATELET
Basophils Absolute: 0 10*3/uL (ref 0.0–0.1)
Eosinophils Absolute: 0.1 10*3/uL (ref 0.0–0.5)
HCT: 37.2 % (ref 34.8–46.6)
HGB: 11.9 g/dL (ref 11.6–15.9)
MCV: 87.3 fL (ref 79.5–101.0)
NEUT#: 2.3 10*3/uL (ref 1.5–6.5)
NEUT%: 49.7 % (ref 38.4–76.8)
RDW: 15.1 % — ABNORMAL HIGH (ref 11.2–14.5)
lymph#: 1.7 10*3/uL (ref 0.9–3.3)

## 2010-04-21 ENCOUNTER — Ambulatory Visit (HOSPITAL_COMMUNITY)
Admission: RE | Admit: 2010-04-21 | Discharge: 2010-04-21 | Disposition: A | Discharge status: Home / Self Care | Payer: 59 | Source: Ambulatory Visit | Attending: Oncology | Admitting: Oncology

## 2010-04-21 ENCOUNTER — Other Ambulatory Visit: Payer: Self-pay | Admitting: Oncology

## 2010-04-21 ENCOUNTER — Encounter (HOSPITAL_BASED_OUTPATIENT_CLINIC_OR_DEPARTMENT_OTHER): Payer: 59 | Admitting: Oncology

## 2010-04-21 DIAGNOSIS — M7989 Other specified soft tissue disorders: Secondary | ICD-10-CM

## 2010-04-21 DIAGNOSIS — R9389 Abnormal findings on diagnostic imaging of other specified body structures: Secondary | ICD-10-CM | POA: Insufficient documentation

## 2010-04-21 DIAGNOSIS — I82409 Acute embolism and thrombosis of unspecified deep veins of unspecified lower extremity: Secondary | ICD-10-CM

## 2010-04-21 DIAGNOSIS — D473 Essential (hemorrhagic) thrombocythemia: Secondary | ICD-10-CM

## 2010-04-21 DIAGNOSIS — D649 Anemia, unspecified: Secondary | ICD-10-CM

## 2010-04-21 DIAGNOSIS — Z7901 Long term (current) use of anticoagulants: Secondary | ICD-10-CM | POA: Insufficient documentation

## 2010-04-21 LAB — CBC WITH DIFFERENTIAL/PLATELET
BASO%: 0.2 % (ref 0.0–2.0)
EOS%: 0.1 % (ref 0.0–7.0)
MCH: 27.8 pg (ref 25.1–34.0)
MCHC: 31.7 g/dL (ref 31.5–36.0)
MONO#: 1.1 10*3/uL — ABNORMAL HIGH (ref 0.1–0.9)
NEUT%: 78.6 % — ABNORMAL HIGH (ref 38.4–76.8)
RBC: 3.85 10*6/uL (ref 3.70–5.45)
RDW: 15.4 % — ABNORMAL HIGH (ref 11.2–14.5)
WBC: 9.5 10*3/uL (ref 3.9–10.3)
lymph#: 0.9 10*3/uL (ref 0.9–3.3)
nRBC: 0 % (ref 0–0)

## 2010-04-21 LAB — PROTIME-INR: INR: 3.2 (ref 2.00–3.50)

## 2010-04-22 ENCOUNTER — Ambulatory Visit: Payer: 59 | Admitting: Vascular Surgery

## 2010-04-25 ENCOUNTER — Other Ambulatory Visit: Payer: Self-pay | Admitting: Oncology

## 2010-04-25 ENCOUNTER — Encounter (HOSPITAL_BASED_OUTPATIENT_CLINIC_OR_DEPARTMENT_OTHER): Payer: 59 | Admitting: Oncology

## 2010-04-25 ENCOUNTER — Ambulatory Visit (INDEPENDENT_AMBULATORY_CARE_PROVIDER_SITE_OTHER): Payer: 59 | Admitting: Vascular Surgery

## 2010-04-25 ENCOUNTER — Encounter (INDEPENDENT_AMBULATORY_CARE_PROVIDER_SITE_OTHER): Payer: 59

## 2010-04-25 DIAGNOSIS — Z5181 Encounter for therapeutic drug level monitoring: Secondary | ICD-10-CM

## 2010-04-25 DIAGNOSIS — I82409 Acute embolism and thrombosis of unspecified deep veins of unspecified lower extremity: Secondary | ICD-10-CM

## 2010-04-25 DIAGNOSIS — Z7901 Long term (current) use of anticoagulants: Secondary | ICD-10-CM

## 2010-04-25 DIAGNOSIS — M7989 Other specified soft tissue disorders: Secondary | ICD-10-CM

## 2010-04-25 DIAGNOSIS — I824Z9 Acute embolism and thrombosis of unspecified deep veins of unspecified distal lower extremity: Secondary | ICD-10-CM

## 2010-04-25 DIAGNOSIS — L539 Erythematous condition, unspecified: Secondary | ICD-10-CM

## 2010-04-25 DIAGNOSIS — D473 Essential (hemorrhagic) thrombocythemia: Secondary | ICD-10-CM

## 2010-04-25 DIAGNOSIS — D649 Anemia, unspecified: Secondary | ICD-10-CM

## 2010-04-25 LAB — CBC WITH DIFFERENTIAL/PLATELET
Eosinophils Absolute: 0.1 10*3/uL (ref 0.0–0.5)
HGB: 8.8 g/dL — ABNORMAL LOW (ref 11.6–15.9)
MCV: 88.2 fL (ref 79.5–101.0)
MONO%: 11.2 % (ref 0.0–14.0)
NEUT#: 4.2 10*3/uL (ref 1.5–6.5)
RBC: 3.23 10*6/uL — ABNORMAL LOW (ref 3.70–5.45)
RDW: 15.6 % — ABNORMAL HIGH (ref 11.2–14.5)
WBC: 6.7 10*3/uL (ref 3.9–10.3)
lymph#: 1.6 10*3/uL (ref 0.9–3.3)
nRBC: 0 % (ref 0–0)

## 2010-04-25 LAB — PROTIME-INR
INR: 3.9 — ABNORMAL HIGH (ref 2.00–3.50)
Protime: 46.8 Seconds — ABNORMAL HIGH (ref 10.6–13.4)

## 2010-04-28 ENCOUNTER — Encounter (HOSPITAL_BASED_OUTPATIENT_CLINIC_OR_DEPARTMENT_OTHER): Payer: 59 | Admitting: Oncology

## 2010-04-28 ENCOUNTER — Other Ambulatory Visit: Payer: Self-pay | Admitting: Oncology

## 2010-04-28 DIAGNOSIS — D473 Essential (hemorrhagic) thrombocythemia: Secondary | ICD-10-CM

## 2010-04-28 DIAGNOSIS — I82409 Acute embolism and thrombosis of unspecified deep veins of unspecified lower extremity: Secondary | ICD-10-CM

## 2010-04-28 DIAGNOSIS — D649 Anemia, unspecified: Secondary | ICD-10-CM

## 2010-04-28 LAB — CBC WITH DIFFERENTIAL/PLATELET
BASO%: 0.6 % (ref 0.0–2.0)
LYMPH%: 22.6 % (ref 14.0–49.7)
MCHC: 30.5 g/dL — ABNORMAL LOW (ref 31.5–36.0)
MCV: 89.8 fL (ref 79.5–101.0)
MONO#: 0.7 10*3/uL (ref 0.1–0.9)
MONO%: 11.1 % (ref 0.0–14.0)
Platelets: 615 10*3/uL — ABNORMAL HIGH (ref 145–400)
RBC: 3.43 10*6/uL — ABNORMAL LOW (ref 3.70–5.45)
RDW: 15.4 % — ABNORMAL HIGH (ref 11.2–14.5)
WBC: 6.4 10*3/uL (ref 3.9–10.3)

## 2010-04-28 LAB — PROTIME-INR: Protime: 19.2 Seconds — ABNORMAL HIGH (ref 10.6–13.4)

## 2010-04-28 NOTE — Assessment & Plan Note (Signed)
OFFICE VISIT  Rachel Yoder, Rachel Yoder DOB:  06/01/1960                                       04/25/2010 QMVHQ#:[id number]  HISTORY OF PRESENT ILLNESS:  This is a 50 year old female well-known to the practice, Dr. Arbie Cookey has been following this patient.  Apparently she recently underwent a significant amount of thrombolysis in both her legs and was placed on Coumadin.  She became hypertherapeutic and after pulling her right arm PICC line she developed ecchymosis and swelling in this right arm.  Her swelling has been going on for about greater than 3 days at this point and the swelling has actually improved.  She was seen by her hematologist today.  The patient was complaining of some numbness in the right arm and swelling.  So hematology required/requested evaluation for possible compartment syndrome.  At this point the patient notes intermittent numbness in her right forearm and her right hand but she is able to complete her activities of daily living.  Actually the swelling has decreased and actually the ecchymosis she notes now has spread into toward her right breast.  She notes that the swelling while previously was tight in the upper arm is actually lessened and she feels actually her forearm feels tighter at this point.  PHYSICAL EXAMINATION:  General:  She is well-developed, well-nourished, in no apparent distress.  Focused examination in the right arm she has ecchymoses and swelling throughout this right upper extremity.  Actually both the forearm and the upper arm are actually soft.  I do not see any clinical evidence of frank compartment syndrome.  She has intact cap refill less than 2 seconds in her fingertips.  There is definitely ecchymoses in the dependent parts of the arm.  Easily palpable radial and brachial pulse.  She has a palpable axillary pulse and the ecchymoses has drained onto her right chest wall.  I had a right arm duplex completed and  the brachial artery and venous flow appears to be intact.  There may be some accessory branches that extend toward what is obvious hematoma measuring 7.1 cm x 5.6 cm with no vascularized flow within the structure.  There is no evidence of any type of AV fistula or pseudoaneurysm in this right arm.  MEDICAL DECISION MAKING:  This is a 50 year old female who is over anticoagulated at this point.  By report, she has an INR greater than 3.9.  She has been already told to hold her Coumadin at this point.  I do not see any advantage to necessarily trying to decompress her as she is overly anticoagulated and I do not think that the upper arm is not under pressure at all.  I think her best option in trying to reduce the swelling of this is extreme elevation of her right arm.  We discussed keeping her arm above the level of her heart throughout tonight and tomorrow and if she does not get improvement of her symptomatology then she is going to come to the ER.  At that point she may need to be evaluated by a hand surgeon to evaluate if there is any advantage to decompressing her compartments.  At this point I do not see any advantage whatsoever as clinically she does not have a compartment syndrome as both her forearm and her upper arm are soft despite the presence of this hematoma in the  upper arm.  She is aware what to look for.  We discussed this extensively and she knows to go to the ER if things do not improve tomorrow and then if everything remains stable or is improving then she will follow with Dr. Arbie Cookey on Tuesday.    Fransisco Hertz, MD Electronically Signed  BLC/MEDQ  D:  04/25/2010  T:  04/28/2010  Job:  [id number]

## 2010-04-29 ENCOUNTER — Encounter (INDEPENDENT_AMBULATORY_CARE_PROVIDER_SITE_OTHER): Payer: 59

## 2010-04-29 ENCOUNTER — Ambulatory Visit: Payer: 59 | Admitting: Vascular Surgery

## 2010-04-29 ENCOUNTER — Ambulatory Visit (INDEPENDENT_AMBULATORY_CARE_PROVIDER_SITE_OTHER): Payer: 59 | Admitting: Vascular Surgery

## 2010-04-29 DIAGNOSIS — Z86718 Personal history of other venous thrombosis and embolism: Secondary | ICD-10-CM

## 2010-04-29 DIAGNOSIS — I8010 Phlebitis and thrombophlebitis of unspecified femoral vein: Secondary | ICD-10-CM

## 2010-04-29 DIAGNOSIS — I824Z9 Acute embolism and thrombosis of unspecified deep veins of unspecified distal lower extremity: Secondary | ICD-10-CM

## 2010-04-29 NOTE — Assessment & Plan Note (Signed)
OFFICE VISIT  Rachel Yoder, Rachel Yoder DOB:  1960-06-29                                       04/29/2010 NGEXB#:[id number]  Patient presents today for follow-up of her recent extensive DVT.  She had undergone a left hip surgery in January and developed a DVT following this.  She had extensive DVT and was admitted to the hospital for evaluation to include a venogram which showed clot up to and into her vena caval filter, which had been placed in 2004 at the time of bariatric surgery.  She underwent successful thrombolysis and mechanical thrombectomy by interventional radiology.  She was subsequently discharged to home on Coumadin.  She did develop a complication with a bleed into her right upper arm from a site of a PICC line.  She had her Coumadin held and is now back on her Coumadin.  She is here today and does look quite good.  She underwent noninvasive vascular laboratory studies in the office, and this does show residual, nonocclusive thrombus, both in her right femoral vein and left distal external iliac, common, and femoral veins. She does have some thrombus in the great saphenous vein as well.  I discussed this at length with patient.  I recommended that she continue lifelong anticoagulation.  I explained that she is going to be at lifelong risk for rethrombosis despite aggressive treatment.  She knows to notify us should she develop any new swelling.  Otherwise, will see [place] on an as-needed basis.    Larina Earthly, M.D. Electronically Signed  TFE/MEDQ  D:  04/29/2010  T:  04/29/2010  Job:  [id number]  cc:   Duncan Dull, M.D. Vanita Panda. Magnus Ivan, M.D. Jethro Bolus, M.D.

## 2010-05-02 ENCOUNTER — Other Ambulatory Visit: Payer: Self-pay | Admitting: Oncology

## 2010-05-02 ENCOUNTER — Encounter (HOSPITAL_BASED_OUTPATIENT_CLINIC_OR_DEPARTMENT_OTHER): Payer: 59 | Admitting: Oncology

## 2010-05-02 DIAGNOSIS — D649 Anemia, unspecified: Secondary | ICD-10-CM

## 2010-05-02 DIAGNOSIS — Z5181 Encounter for therapeutic drug level monitoring: Secondary | ICD-10-CM

## 2010-05-02 DIAGNOSIS — Z7901 Long term (current) use of anticoagulants: Secondary | ICD-10-CM

## 2010-05-02 DIAGNOSIS — I82409 Acute embolism and thrombosis of unspecified deep veins of unspecified lower extremity: Secondary | ICD-10-CM

## 2010-05-02 DIAGNOSIS — D473 Essential (hemorrhagic) thrombocythemia: Secondary | ICD-10-CM

## 2010-05-02 LAB — CBC WITH DIFFERENTIAL/PLATELET
Basophils Absolute: 0 10*3/uL (ref 0.0–0.1)
Eosinophils Absolute: 0.1 10*3/uL (ref 0.0–0.5)
HCT: 31.1 % — ABNORMAL LOW (ref 34.8–46.6)
HGB: 10 g/dL — ABNORMAL LOW (ref 11.6–15.9)
LYMPH%: 25.8 % (ref 14.0–49.7)
MCV: 87.8 fL (ref 79.5–101.0)
MONO%: 8.9 % (ref 0.0–14.0)
NEUT#: 4.2 10*3/uL (ref 1.5–6.5)
NEUT%: 63.9 % (ref 38.4–76.8)
Platelets: 589 10*3/uL — ABNORMAL HIGH (ref 145–400)

## 2010-05-03 NOTE — Procedures (Unsigned)
VASCULAR LAB EXAM  INDICATION:  Right upper extremity pain, swelling and discoloration with history of PICC line removal several weeks ago per patient.  HISTORY: Diabetes:  No. Cardiac:  No. Hypertension:  No.  EXAM:  Right upper extremity duplex.  IMPRESSION: 1. Right upper arm brachial arterial and venous flow appear within     normal limits with possible accessory flow mid upper arm near the     distal end of structure. 2. Hypoechoic structure in right upper arm measuring 7.1 cm x 5.6 cm     with no flow visualized within structure. 3. No evidence of pseudoaneurysm or arteriovenous malformation     visualized in the right upper arm.  ___________________________________________ Fransisco Hertz, MD  AS/MEDQ  D:  04/25/2010  T:  04/25/2010  Job:  [id number]

## 2010-05-08 NOTE — Procedures (Unsigned)
DUPLEX DEEP VENOUS EXAM - LOWER EXTREMITY  INDICATION:  Follow up thrombophlebitis and deep venous thrombosis.  HISTORY:  Edema:  Yes. Trauma/Surgery:  Left hip replacement, 02/07/2010. Pain:  No. PE:  No. Previous DVT:  Yes. Anticoagulants:  Yes. Other:  DUPLEX EXAM:               CFV   SFV   PopV  PTV    GSV               R  L  R  L  R  L  R   L  R  L Thrombosis    o  p  p  o  o  o  o   o  o  p Spontaneous   +  +  +  +  +  +         +  + Phasic        +  +  +  +  +  +         +  + Augmentation  +  +  +  +  +  +         +  + Compressible  +  p  p  +  +  +         +  p Competent     +  +  +  +  o  +  Legend:  + - yes  o - no  p - partial  D - decreased  IMPRESSION: 1. Residual nonoccluding deep venous thrombosis present involving the     right proximal to mid superficial femoral vein and the left distal     external iliac and common femoral veins. 2. Residual nonoccluding superficial thrombus involving the left     greater saphenous vein at the saphenofemoral junction. 3. The remainder of the bilateral deep venous system appears patent     with a limited evaluation of the calf veins due to vessel depth and     small vessel size. 4. Reflux of >500 milliseconds is present in the right popliteal vein.   _____________________________ Larina Earthly, M.D.  SH/MEDQ  D:  04/29/2010  T:  04/29/2010  Job:  [id number]

## 2010-05-09 ENCOUNTER — Other Ambulatory Visit: Payer: Self-pay | Admitting: Oncology

## 2010-05-09 ENCOUNTER — Encounter (HOSPITAL_BASED_OUTPATIENT_CLINIC_OR_DEPARTMENT_OTHER): Payer: 59 | Admitting: Oncology

## 2010-05-09 DIAGNOSIS — Z7901 Long term (current) use of anticoagulants: Secondary | ICD-10-CM

## 2010-05-09 DIAGNOSIS — I82409 Acute embolism and thrombosis of unspecified deep veins of unspecified lower extremity: Secondary | ICD-10-CM

## 2010-05-09 DIAGNOSIS — Z5181 Encounter for therapeutic drug level monitoring: Secondary | ICD-10-CM

## 2010-05-09 DIAGNOSIS — D473 Essential (hemorrhagic) thrombocythemia: Secondary | ICD-10-CM

## 2010-05-09 LAB — PROTIME-INR: INR: 2.5 (ref 2.00–3.50)

## 2010-05-15 ENCOUNTER — Encounter (HOSPITAL_BASED_OUTPATIENT_CLINIC_OR_DEPARTMENT_OTHER): Payer: 59 | Admitting: Oncology

## 2010-05-15 ENCOUNTER — Other Ambulatory Visit: Payer: Self-pay | Admitting: Oncology

## 2010-05-15 DIAGNOSIS — D473 Essential (hemorrhagic) thrombocythemia: Secondary | ICD-10-CM

## 2010-05-15 DIAGNOSIS — I82409 Acute embolism and thrombosis of unspecified deep veins of unspecified lower extremity: Secondary | ICD-10-CM

## 2010-05-15 DIAGNOSIS — Z7901 Long term (current) use of anticoagulants: Secondary | ICD-10-CM

## 2010-05-15 DIAGNOSIS — D649 Anemia, unspecified: Secondary | ICD-10-CM

## 2010-05-15 LAB — PROTIME-INR: INR: 2.4 (ref 2.00–3.50)

## 2010-05-20 ENCOUNTER — Encounter (HOSPITAL_BASED_OUTPATIENT_CLINIC_OR_DEPARTMENT_OTHER): Payer: 59 | Admitting: Oncology

## 2010-05-20 ENCOUNTER — Other Ambulatory Visit: Payer: Self-pay | Admitting: Oncology

## 2010-05-20 DIAGNOSIS — D649 Anemia, unspecified: Secondary | ICD-10-CM

## 2010-05-20 DIAGNOSIS — Z7901 Long term (current) use of anticoagulants: Secondary | ICD-10-CM

## 2010-05-20 DIAGNOSIS — I82409 Acute embolism and thrombosis of unspecified deep veins of unspecified lower extremity: Secondary | ICD-10-CM

## 2010-05-20 DIAGNOSIS — D473 Essential (hemorrhagic) thrombocythemia: Secondary | ICD-10-CM

## 2010-05-20 LAB — PROTIME-INR: INR: 2.9 (ref 2.00–3.50)

## 2010-05-30 ENCOUNTER — Other Ambulatory Visit: Payer: Self-pay | Admitting: Oncology

## 2010-05-30 ENCOUNTER — Encounter (HOSPITAL_BASED_OUTPATIENT_CLINIC_OR_DEPARTMENT_OTHER): Payer: 59 | Admitting: Oncology

## 2010-05-30 DIAGNOSIS — Z5181 Encounter for therapeutic drug level monitoring: Secondary | ICD-10-CM

## 2010-05-30 DIAGNOSIS — D473 Essential (hemorrhagic) thrombocythemia: Secondary | ICD-10-CM

## 2010-05-30 DIAGNOSIS — Z7901 Long term (current) use of anticoagulants: Secondary | ICD-10-CM

## 2010-05-30 DIAGNOSIS — Z86718 Personal history of other venous thrombosis and embolism: Secondary | ICD-10-CM

## 2010-05-30 LAB — COMPREHENSIVE METABOLIC PANEL
ALT: 22 U/L (ref 0–35)
AST: 22 U/L (ref 0–37)
Alkaline Phosphatase: 127 U/L — ABNORMAL HIGH (ref 39–117)
CO2: 27 mEq/L (ref 19–32)
Creatinine, Ser: 0.68 mg/dL (ref 0.40–1.20)
Sodium: 139 mEq/L (ref 135–145)
Total Bilirubin: 0.5 mg/dL (ref 0.3–1.2)
Total Protein: 6.6 g/dL (ref 6.0–8.3)

## 2010-05-30 LAB — PROTIME-INR: Protime: 28.8 Seconds — ABNORMAL HIGH (ref 10.6–13.4)

## 2010-05-30 LAB — CBC WITH DIFFERENTIAL/PLATELET
BASO%: 0.5 % (ref 0.0–2.0)
EOS%: 1.4 % (ref 0.0–7.0)
LYMPH%: 30.9 % (ref 14.0–49.7)
MCH: 27.6 pg (ref 25.1–34.0)
MCHC: 32.6 g/dL (ref 31.5–36.0)
MONO#: 0.3 10*3/uL (ref 0.1–0.9)
Platelets: 317 10*3/uL (ref 145–400)
RBC: 4.17 10*6/uL (ref 3.70–5.45)
WBC: 3.8 10*3/uL — ABNORMAL LOW (ref 3.9–10.3)

## 2010-06-10 ENCOUNTER — Other Ambulatory Visit: Payer: Self-pay | Admitting: Oncology

## 2010-06-10 ENCOUNTER — Encounter (HOSPITAL_BASED_OUTPATIENT_CLINIC_OR_DEPARTMENT_OTHER): Payer: 59 | Admitting: Oncology

## 2010-06-10 DIAGNOSIS — I82409 Acute embolism and thrombosis of unspecified deep veins of unspecified lower extremity: Secondary | ICD-10-CM

## 2010-06-10 DIAGNOSIS — Z7901 Long term (current) use of anticoagulants: Secondary | ICD-10-CM

## 2010-06-10 LAB — PROTIME-INR: Protime: 24 Seconds — ABNORMAL HIGH (ref 10.6–13.4)

## 2010-06-20 ENCOUNTER — Other Ambulatory Visit: Payer: Self-pay | Admitting: Oncology

## 2010-06-20 ENCOUNTER — Encounter (HOSPITAL_BASED_OUTPATIENT_CLINIC_OR_DEPARTMENT_OTHER): Payer: 59 | Admitting: Oncology

## 2010-06-20 DIAGNOSIS — I82409 Acute embolism and thrombosis of unspecified deep veins of unspecified lower extremity: Secondary | ICD-10-CM

## 2010-06-20 DIAGNOSIS — Z7901 Long term (current) use of anticoagulants: Secondary | ICD-10-CM

## 2010-06-20 DIAGNOSIS — Z86718 Personal history of other venous thrombosis and embolism: Secondary | ICD-10-CM

## 2010-06-20 LAB — PROTIME-INR
INR: 2.3 (ref 2.00–3.50)
Protime: 27.6 Seconds — ABNORMAL HIGH (ref 10.6–13.4)

## 2010-06-20 NOTE — Op Note (Signed)
NAME:  Rachel Yoder, Rachel Yoder                        ACCOUNT NO.:  [id number]   MEDICAL RECORD NO.:  [id number]                   PATIENT TYPE:  AMB   LOCATION:  ENDO                                 FACILITY:  [facility]   PHYSICIAN:  Graylin Shiver, M.D.                DATE OF BIRTH:  26-Dec-1960   DATE OF PROCEDURE:  10/19/2002  DATE OF DISCHARGE:                                 OPERATIVE REPORT   PROCEDURE:  Esophagogastroduodenoscopy with biopsy.   INDICATIONS FOR PROCEDURE:  This patient is a 50 year old female, who is  about to undergo gastric bypass surgery.  She has been on NSAIDs, and upper  endoscopy is being done to evaluate the upper GI tract to look for any  significant pathology prior to gastric bypass surgery.   Informed consent was obtained after explanation of the risks of bleeding,  infection, and perforation.   PREMEDICATIONS:  1. Fentanyl 75 mcg IV.  2. Versed 8 mg IV.   DESCRIPTION OF PROCEDURE:  With the patient in the left lateral decubitus  position, the Olympus gastroscope was inserted into the oropharynx and  passed into the esophagus.  It was advanced down the esophagus, then into  the stomach and into the duodenum.  The second portion and bulb of the  duodenum were normal.  The stomach showed erythema in the antrum and body  compatible with mild gastritis.  There were three small erosions present in  the antrum of the stomach.  I suspect that these are secondary to NSAID use.  Biopsy for CLOtest was obtained to look for evidence of Helicobacter pylori.  In the body and fundus of the stomach, there were a few small 3 mm gastric  polyps which were scattered in the area.  These had a fleshy color to them.  Random biopsies were obtained.  They did not appear to be of any clinical  significance.  The scope was straightened and brought into the esophagus.  The esophagogastric junction was at 40 cm from the incisor teeth.  The  distal, mid, and proximal esophagus  looked normal.  She tolerated the  procedure well without complications.   IMPRESSION:  1. Gastritis with three small erosion present in the antrum which I suspect     are secondary to NSAID use.  2.     A few small gastric polyps present in the upper body and fundus of the     stomach.  Random biopsies were obtained.  These do not appear to be of     any clinical significance.   PLAN:  The biopsies will be checked.                                               Graylin Shiver, M.D.  SFG/MEDQ  D:  10/19/2002  T:  10/19/2002  Job:  [id number]   cc:   Vikki Ports, M.D.  [address], [address]  [place]  [place] [address]  Fax: [phone]

## 2010-06-20 NOTE — Op Note (Signed)
NAME:  TORUNN, CHANCELLOR                        ACCOUNT NO.:  [id number]   MEDICAL RECORD NO.:  [id number]                   PATIENT TYPE:  INP   LOCATION:  X001                                 FACILITY:  [facility]   PHYSICIAN:  Sandria Bales. Ezzard Standing, M.D.               DATE OF BIRTH:  Mar 18, 1960   DATE OF PROCEDURE:  01/23/2003  DATE OF DISCHARGE:                                 OPERATIVE REPORT   PREOPERATIVE DIAGNOSIS:  Morbid obesity with body mass index of  approximately 45.   POSTOPERATIVE DIAGNOSIS:  Morbid obesity with a body mass index of  approximately 45.   PROCEDURE:  Esophagogastroscopy.   SURGEON:  Sandria Bales. Ezzard Standing, M.D.   ANESTHESIA:  General endotracheal.   ESTIMATED BLOOD LOSS:  Minimal.   INDICATION FOR PROCEDURE:  Ms. Levay is a 50 year old white female who  has undergone a laparoscopic Roux-en-Y gastrojejunostomy by Lorne Skeens.  Hoxworth, M.D.  I am endoscoping the patient to confirm patency of the  anastomosis, to check for leaks, and make sure there is no bleeding.   The patient is in a supine position with a general endotracheal anesthetic.  Dr. Johna Sheriff is manning the laparoscope and has the bowel clamped off.  I am  passing the endoscope.   A flexible Olympus endoscope was passed in the back of the throat, down into  the esophagus and into the gastric pouch.   The anostosis was visualized, photos taken of the anastomosis, which was  widely patent, was probably about 2.5, maybe 3 cm in diameter.  There was no  bleeding from the anastomosis or from the stapled stomach.  At the same time  I was insufflating the stomach, Dr. Johna Sheriff was flooding the upper abdomen  to make sure there was no bubbling.  I then measured from GE junction that  was right at about 46-47 cm, I'm sorry, the gastrojejunal anastomosis is at  46-47 cm from the GE junction right at 40-41 cm, so this was about a 6-7 cm  pouch.  The esophagus was unremarkable.  The endoscope was then  withdrawn.   The patient tolerated the surgical procedure well with no evidence of leak,  a patent anastomosis, and no evidence of bleeding.  Dr. Johna Sheriff will  continue his portion of the operation and dictate the major portion of the  operation.                                               Sandria Bales. Ezzard Standing, M.D.    DHN/MEDQ  D:  01/23/2003  T:  01/23/2003  Job:  [id number]   cc:   Lorne Skeens. Hoxworth, M.D.  [address]  [place]  [place] [address]  Fax: [phone]

## 2010-06-20 NOTE — Discharge Summary (Signed)
NAME:  Rachel Yoder, Rachel Yoder                        ACCOUNT NO.:  [id number]   MEDICAL RECORD NO.:  [id number]                   PATIENT TYPE:  INP   LOCATION:  0471                                 FACILITY:  [facility]   PHYSICIAN:  Sharlet Salina T. Hoxworth, M.D.          DATE OF BIRTH:  01-10-1961   DATE OF ADMISSION:  01/23/2003  DATE OF DISCHARGE:  01/26/2003                                 DISCHARGE SUMMARY   DISCHARGE DIAGNOSES:  1. Morbid obesity.  2. Osteoarthritis.  3. History of deep venous thrombosis.   OPERATIONS AND PROCEDURES:  Laparoscopic Roux-en-Y gastric bypass on  January 23, 2003.   HISTORY OF PRESENT ILLNESS:  Rachel Yoder is a 50 year old white female  with a many year history of morbid obesity with steadily increasing weight.  She has failed many medically supervised diets. Weight has fluctuated  generally 325 and 400 pounds. After extensive discussion of options she is  admitted for Roux-en-Y gastric bypass for morbid obesity.   PAST MEDICAL HISTORY:  1. Osteoarthritis to both knees.  2. History of deep venous thrombosis following foot surgery in the past.   MEDICATIONS:  Naprosyn, which has been discontinued preoperatively.   Social history, family history, and review of systems, see the initial H&P.   PERTINENT PHYSICAL EXAMINATION:  She is 6 feet 2 inches, weight 421 pounds,  BMI 53.5. The remainder of the abdominal exam is remarkable only for morbid  obesity.   HOSPITAL COURSE:  The patient was admitted on the morning of the procedure  and underwent an uneventful laparoscopic Roux-en-Y gastric bypass.  Her  postoperative course was smooth. Doppler of the lower extremities was  negative on the first postoperative day.  Gastrografin swallow showed no  leak or obstructions. She was begun on clear liquid diet.  She tolerated  this well and was able to be advanced to the Glucerna diet bilaterally  postoperative day #2. At this point she had no nausea or abdominal  pain.  White count was 7.7, hemoglobin 11.8. The abdomen was soft and nontender.  The wound is healing well. JP drainage showed only serious illnesses.  She  will be discharged home at this time.   MEDICATIONS:  Roxicet for pain.   FOLLOWUP:  In my office in five days.                                               Lorne Skeens. Hoxworth, M.D.   Tory Emerald  D:  03/01/2003  T:  03/01/2003  Job:  [id number]

## 2010-06-20 NOTE — Op Note (Signed)
NAME:  Rachel Yoder, Rachel Yoder                        ACCOUNT NO.:  [id number]   MEDICAL RECORD NO.:  [id number]                   PATIENT TYPE:  OIB   LOCATION:  2853                                 FACILITY:  [facility]   PHYSICIAN:  Di Kindle. Edilia Bo, M.D.        DATE OF BIRTH:  1960/06/26   DATE OF PROCEDURE:  01/22/2003  DATE OF DISCHARGE:                                 OPERATIVE REPORT   PROCEDURES PERFORMED:  1. Cavagram.  2. Placement of IVC filter (Trap-Ease).   SURGEON:  Di Kindle. Edilia Bo, M.D.   ASSISTANT:  Nurse.   ANESTHESIA:  Local with sedation.   DESCRIPTION OF PROCEDURE:  The patient was brought to the peripheral  vascular lab at [facility], sedated with 1 mg of Versed and 50 mcg of fentanyl.  The right groin was prepped and draped in the usual sterile fashion.  After  the skin was infiltrated with 1% lidocaine, the right common femoral vein  was cannulated and the dilator and introducer were advanced over the wire  and positioned in the distal inferior vena cava.  The wire was then removed  and a cavagram was shot through the dilator.  The diameter of the cava was  felt to be less than 30 mm.  Next, the wire was reintroduced and the sheath  positioned below the level of the renal vein which was identified on the  cavagram.  The dilator was removed and then the filter was placed into the  sheath and then advanced to the obturator to the top part of the sheath.  This was then positioned below the renal vein and then the filter was  deployed.  The sheath was then withdrawn back and a repeat cavagram was  obtained.  It showed excellent position of the filter.  Next, the sheath was  removed, pressure was held for hemostasis.  The patient tolerated the  procedure well and was transferred to the recovery room in satisfactory  condition.                                               Di Kindle. Edilia Bo, M.D.    CSD/MEDQ  D:  01/22/2003  T:  01/22/2003  Job:  [id number]

## 2010-06-20 NOTE — Op Note (Signed)
NAME:  Rachel Yoder, Rachel Yoder                        ACCOUNT NO.:  [id number]   MEDICAL RECORD NO.:  [id number]                   PATIENT TYPE:  INP   LOCATION:  X001                                 FACILITY:  [facility]   PHYSICIAN:  Sharlet Salina T. Hoxworth, M.D.          DATE OF BIRTH:  August 01, 1960   DATE OF PROCEDURE:  01/23/2003  DATE OF DISCHARGE:                                 OPERATIVE REPORT   PREOPERATIVE DIAGNOSIS:  Morbid obesity.   POSTOPERATIVE DIAGNOSIS:  Morbid obesity.   PROCEDURE:  Laparoscopic roux-Y gastric bypass.   SURGEON:  Lorne Skeens. Hoxworth, M.D.   ASSISTANT:  Sandria Bales. Ezzard Standing, M.D.   ANESTHESIA:  General.   BRIEF HISTORY:  [name] is a 50 year old white female with a long  history of morbid obesity refractory to medical management.  She has  comorbidity of osteoarthritis.  After extensive discussion of options we  have elected to proceed with laparoscopic roux-Y gastric bypass.  The nature  of the procedure, its indications and risks have been discussed in detail  and documented elsewhere. She is now brought to the operating room for this  procedure.   DESCRIPTION OF PROCEDURE:  The patient was brought to the operating room,  placed in supine position on the operating table and general endotracheal  anesthesia was induced. She received preoperative antibiotics. Lovenox was  given preoperatively.  PAS were in place. The abdomen was widely sterilely  prepped and draped.  Access was obtained with a 12 mm Optiview trocar in the  left subcostal space without difficulty and pneumoperitoneum established.  Under direct vision, 12 mm trocars were placed in the left and right mid  abdomen and in the epigastrium through the falciform ligament and a 5 mm  trocar placed in the left flank.  The omentum and transverse colon were  lifted and the ligament of Treitz identified and a 40 mm afferent limb  measured. The bowel was divided here with a single firing of the 45 mm  white  load stapler. The mesentery was further divided mobilizing the efferent limb  with the harmonic scalpel. The efferent limb then was marked with a Penrose  drain, sutured to its end and then a 100 cm efferent limb was measured.  At  this point, a jejunojejunostomy was created into the afferent limb and the  efferent limb with a single firing of the endoGIA white load 45 mm stapler.  The staple line was inspected for bleeding and then the common enterotomy  was closed with running 2-0 Vicryl beginning at either end of the enterotomy  and tied centrally.  Following this, the mesenteric defect behind the  afferent limb was identified and this was closed with a running 2-0 silk  suture working out onto the jejunojejunostomy and finishing suturing the  afferent to the efferent limb.  Following this, Tisseel tissue sealant was  placed over the jejunojejunostomy.  At that point, the patient was placed  in  reverse Trendelenburg and through a 5 mm stab incision in the epigastrium,  the Nathanson retractor placed and the left lobe of the liver elevated  exposing the proximal stomach and EG junction.  The stomach was retracted  inferiorly and the angle of His exposed and angle of His was mobilized off  of the left crus.  Following this, a point along the lesser curve was chosen  for division of the stomach at approximately 5 cm from the EG junction  between the first and second vessel.  The lesser curve was cleared of  mesentery and dissection carried to the lesser sac using the harmonic  scalpel and the lesser sac relatively easily entered.  All tubes were  withdrawn from the stomach. An additional firing of the endoGIA blue load  stapler was used with right angles across the lesser curve. Following this,  four more firings of the same stapler working up towards the angle of His  were used to create a nice small tubular pouch which was seen to be  completely separated from the distal stomach.  The efferent limb was brought  up to the gastric pouch without undue tension.  A posterior row of 2-0  Vicryl was initially placed between the efferent limb and the gastric pouch  along the staple line working superior to inferior.  Using the [facility] 352 tube as  a guide in the stomach, enterotomy was made in the stomach and the small  bowel with the harmonic scalpel and a blue load 45 mm linear stapler was  used to create an approximately 2 cm anastomosis.  The staple line was  inspected for hemostasis and appeared dry.  The enterotomy was then closed  on either corner working centrally with running 2-0 Vicryl.  Following this,  the Ewald tube was passed through the anastomosis without difficulty and the  outer anterior layer of running 2-0 Vicryl was placed.  Following this, the  patient was endoscoped by Dr. Ezzard Standing and with the pouch distended with air  and under saline, there was no evidence of leak.  Anastomosis appeared  patent. There was no bleeding.  Gas was suctioned from the stomach and the  saline evacuated. Tisseel tissue sealant was then placed over the staple and  suture lines of the gastrojejunostomy.  A closed suction drain was brought  out through the lateral trocar site left at the subhepatic space.  The  Nathanson retractor and all trocars removed and CO2 evacuated from the  peritoneal cavity.  Sponge, needle and instrument counts were correct.  All  skin incisions were closed with staples.  Dry sterile dressings were  applied. The patient was taken to the recovery room in good condition.                                               Lorne Skeens. Hoxworth, M.D.    Tory Emerald  D:  01/23/2003  T:  01/23/2003  Job:  [id number]

## 2010-06-27 ENCOUNTER — Encounter (HOSPITAL_BASED_OUTPATIENT_CLINIC_OR_DEPARTMENT_OTHER): Payer: 59 | Admitting: Oncology

## 2010-06-27 ENCOUNTER — Other Ambulatory Visit: Payer: Self-pay | Admitting: Oncology

## 2010-06-27 DIAGNOSIS — I82409 Acute embolism and thrombosis of unspecified deep veins of unspecified lower extremity: Secondary | ICD-10-CM

## 2010-06-27 DIAGNOSIS — Z7901 Long term (current) use of anticoagulants: Secondary | ICD-10-CM

## 2010-06-27 DIAGNOSIS — Z86718 Personal history of other venous thrombosis and embolism: Secondary | ICD-10-CM

## 2010-06-27 LAB — PROTIME-INR
INR: 2.7 (ref 2.00–3.50)
Protime: 32.4 Seconds — ABNORMAL HIGH (ref 10.6–13.4)

## 2010-07-11 ENCOUNTER — Other Ambulatory Visit: Payer: Self-pay | Admitting: Oncology

## 2010-07-11 ENCOUNTER — Encounter (HOSPITAL_BASED_OUTPATIENT_CLINIC_OR_DEPARTMENT_OTHER): Payer: 59 | Admitting: Oncology

## 2010-07-11 DIAGNOSIS — Z7901 Long term (current) use of anticoagulants: Secondary | ICD-10-CM

## 2010-07-11 DIAGNOSIS — Z86718 Personal history of other venous thrombosis and embolism: Secondary | ICD-10-CM

## 2010-07-11 DIAGNOSIS — I82409 Acute embolism and thrombosis of unspecified deep veins of unspecified lower extremity: Secondary | ICD-10-CM

## 2010-07-11 LAB — PROTIME-INR: Protime: 30 Seconds — ABNORMAL HIGH (ref 10.6–13.4)

## 2010-08-01 ENCOUNTER — Encounter (HOSPITAL_BASED_OUTPATIENT_CLINIC_OR_DEPARTMENT_OTHER): Payer: 59 | Admitting: Oncology

## 2010-08-01 ENCOUNTER — Other Ambulatory Visit: Payer: Self-pay | Admitting: Oncology

## 2010-08-01 DIAGNOSIS — Z86718 Personal history of other venous thrombosis and embolism: Secondary | ICD-10-CM

## 2010-08-01 DIAGNOSIS — Z7901 Long term (current) use of anticoagulants: Secondary | ICD-10-CM

## 2010-08-11 ENCOUNTER — Other Ambulatory Visit: Payer: Self-pay | Admitting: Oncology

## 2010-08-11 ENCOUNTER — Encounter (HOSPITAL_BASED_OUTPATIENT_CLINIC_OR_DEPARTMENT_OTHER): Payer: 59 | Admitting: Oncology

## 2010-08-11 DIAGNOSIS — Z86718 Personal history of other venous thrombosis and embolism: Secondary | ICD-10-CM

## 2010-08-11 DIAGNOSIS — Z7901 Long term (current) use of anticoagulants: Secondary | ICD-10-CM

## 2010-08-11 LAB — CBC WITH DIFFERENTIAL/PLATELET
Eosinophils Absolute: 0.1 10*3/uL (ref 0.0–0.5)
MONO#: 0.6 10*3/uL (ref 0.1–0.9)
MONO%: 9.6 % (ref 0.0–14.0)
NEUT#: 3.6 10*3/uL (ref 1.5–6.5)
RBC: 4.58 10*6/uL (ref 3.70–5.45)
RDW: 16.1 % — ABNORMAL HIGH (ref 11.2–14.5)
WBC: 5.7 10*3/uL (ref 3.9–10.3)
lymph#: 1.5 10*3/uL (ref 0.9–3.3)
nRBC: 0 % (ref 0–0)

## 2010-08-11 LAB — PROTIME-INR
INR: 1.3 — ABNORMAL LOW (ref 2.00–3.50)
Protime: 15.6 Seconds — ABNORMAL HIGH (ref 10.6–13.4)

## 2010-08-22 ENCOUNTER — Other Ambulatory Visit: Payer: Self-pay | Admitting: Oncology

## 2010-08-22 ENCOUNTER — Encounter (HOSPITAL_BASED_OUTPATIENT_CLINIC_OR_DEPARTMENT_OTHER): Payer: 59 | Admitting: Oncology

## 2010-08-22 DIAGNOSIS — Z86718 Personal history of other venous thrombosis and embolism: Secondary | ICD-10-CM

## 2010-08-22 DIAGNOSIS — D473 Essential (hemorrhagic) thrombocythemia: Secondary | ICD-10-CM

## 2010-08-22 DIAGNOSIS — Z7901 Long term (current) use of anticoagulants: Secondary | ICD-10-CM

## 2010-08-22 LAB — CBC WITH DIFFERENTIAL/PLATELET
BASO%: 0.8 % (ref 0.0–2.0)
Eosinophils Absolute: 0.1 10*3/uL (ref 0.0–0.5)
LYMPH%: 33.3 % (ref 14.0–49.7)
MCHC: 31.6 g/dL (ref 31.5–36.0)
MONO#: 0.4 10*3/uL (ref 0.1–0.9)
NEUT#: 2.7 10*3/uL (ref 1.5–6.5)
Platelets: 299 10*3/uL (ref 145–400)
RBC: 4.61 10*6/uL (ref 3.70–5.45)
RDW: 17 % — ABNORMAL HIGH (ref 11.2–14.5)
WBC: 4.9 10*3/uL (ref 3.9–10.3)
lymph#: 1.6 10*3/uL (ref 0.9–3.3)
nRBC: 0 % (ref 0–0)

## 2010-08-22 LAB — PROTIME-INR
INR: 1.2 — ABNORMAL LOW (ref 2.00–3.50)
Protime: 14.4 Seconds — ABNORMAL HIGH (ref 10.6–13.4)

## 2010-08-26 ENCOUNTER — Encounter (HOSPITAL_BASED_OUTPATIENT_CLINIC_OR_DEPARTMENT_OTHER): Payer: 59 | Admitting: Oncology

## 2010-08-26 ENCOUNTER — Other Ambulatory Visit: Payer: Self-pay | Admitting: Oncology

## 2010-08-26 DIAGNOSIS — Z7901 Long term (current) use of anticoagulants: Secondary | ICD-10-CM

## 2010-08-26 DIAGNOSIS — Z86718 Personal history of other venous thrombosis and embolism: Secondary | ICD-10-CM

## 2010-08-26 LAB — PROTIME-INR
INR: 1.4 — ABNORMAL LOW (ref 2.00–3.50)
Protime: 16.8 Seconds — ABNORMAL HIGH (ref 10.6–13.4)

## 2010-08-29 ENCOUNTER — Other Ambulatory Visit: Payer: Self-pay | Admitting: Oncology

## 2010-08-29 ENCOUNTER — Encounter (HOSPITAL_BASED_OUTPATIENT_CLINIC_OR_DEPARTMENT_OTHER): Payer: 59 | Admitting: Oncology

## 2010-08-29 DIAGNOSIS — Z5181 Encounter for therapeutic drug level monitoring: Secondary | ICD-10-CM

## 2010-08-29 DIAGNOSIS — I82409 Acute embolism and thrombosis of unspecified deep veins of unspecified lower extremity: Secondary | ICD-10-CM

## 2010-08-29 DIAGNOSIS — Z86718 Personal history of other venous thrombosis and embolism: Secondary | ICD-10-CM

## 2010-08-29 DIAGNOSIS — Z7901 Long term (current) use of anticoagulants: Secondary | ICD-10-CM

## 2010-08-29 LAB — CBC WITH DIFFERENTIAL/PLATELET
BASO%: 0.9 % (ref 0.0–2.0)
EOS%: 1 % (ref 0.0–7.0)
HCT: 37.3 % (ref 34.8–46.6)
MCH: 27.8 pg (ref 25.1–34.0)
MCHC: 33 g/dL (ref 31.5–36.0)
MONO#: 0.5 10*3/uL (ref 0.1–0.9)
NEUT%: 53.9 % (ref 38.4–76.8)
RBC: 4.42 10*6/uL (ref 3.70–5.45)
WBC: 4.1 10*3/uL (ref 3.9–10.3)
lymph#: 1.3 10*3/uL (ref 0.9–3.3)

## 2010-08-29 LAB — COMPREHENSIVE METABOLIC PANEL
ALT: 34 U/L (ref 0–35)
Alkaline Phosphatase: 132 U/L — ABNORMAL HIGH (ref 39–117)
CO2: 27 mEq/L (ref 19–32)
Creatinine, Ser: 0.7 mg/dL (ref 0.50–1.10)
Glucose, Bld: 79 mg/dL (ref 70–99)
Total Bilirubin: 0.7 mg/dL (ref 0.3–1.2)

## 2010-08-29 LAB — PROTIME-INR: Protime: 18 Seconds — ABNORMAL HIGH (ref 10.6–13.4)

## 2010-09-02 ENCOUNTER — Encounter (HOSPITAL_BASED_OUTPATIENT_CLINIC_OR_DEPARTMENT_OTHER): Payer: 59 | Admitting: Oncology

## 2010-09-02 ENCOUNTER — Other Ambulatory Visit: Payer: Self-pay | Admitting: Oncology

## 2010-09-02 DIAGNOSIS — Z7901 Long term (current) use of anticoagulants: Secondary | ICD-10-CM

## 2010-09-02 DIAGNOSIS — Z86718 Personal history of other venous thrombosis and embolism: Secondary | ICD-10-CM

## 2010-09-02 LAB — PROTIME-INR

## 2010-09-05 ENCOUNTER — Other Ambulatory Visit: Payer: Self-pay | Admitting: Oncology

## 2010-09-05 ENCOUNTER — Encounter (HOSPITAL_BASED_OUTPATIENT_CLINIC_OR_DEPARTMENT_OTHER): Payer: 59 | Admitting: Oncology

## 2010-09-05 DIAGNOSIS — Z86718 Personal history of other venous thrombosis and embolism: Secondary | ICD-10-CM

## 2010-09-05 DIAGNOSIS — Z7901 Long term (current) use of anticoagulants: Secondary | ICD-10-CM

## 2010-09-05 LAB — PROTIME-INR: INR: 1.8 — ABNORMAL LOW (ref 2.00–3.50)

## 2010-09-09 ENCOUNTER — Encounter: Payer: 59 | Admitting: Oncology

## 2010-09-12 ENCOUNTER — Encounter: Payer: 59 | Admitting: Oncology

## 2010-09-12 ENCOUNTER — Other Ambulatory Visit: Payer: Self-pay | Admitting: Oncology

## 2010-09-12 LAB — PROTIME-INR: Protime: 24 Seconds — ABNORMAL HIGH (ref 10.6–13.4)

## 2010-09-16 ENCOUNTER — Other Ambulatory Visit: Payer: Self-pay | Admitting: Oncology

## 2010-09-16 ENCOUNTER — Encounter (HOSPITAL_BASED_OUTPATIENT_CLINIC_OR_DEPARTMENT_OTHER): Payer: 59 | Admitting: Oncology

## 2010-09-16 DIAGNOSIS — Z7901 Long term (current) use of anticoagulants: Secondary | ICD-10-CM

## 2010-09-16 DIAGNOSIS — Z86718 Personal history of other venous thrombosis and embolism: Secondary | ICD-10-CM

## 2010-09-16 LAB — PROTIME-INR: Protime: 27.6 Seconds — ABNORMAL HIGH (ref 10.6–13.4)

## 2010-09-22 ENCOUNTER — Encounter (HOSPITAL_BASED_OUTPATIENT_CLINIC_OR_DEPARTMENT_OTHER): Payer: 59 | Admitting: Oncology

## 2010-09-22 ENCOUNTER — Other Ambulatory Visit: Payer: Self-pay | Admitting: Oncology

## 2010-09-22 DIAGNOSIS — Z86718 Personal history of other venous thrombosis and embolism: Secondary | ICD-10-CM

## 2010-09-22 DIAGNOSIS — Z7901 Long term (current) use of anticoagulants: Secondary | ICD-10-CM

## 2010-09-22 LAB — PROTIME-INR: INR: 2.7 (ref 2.00–3.50)

## 2010-09-29 ENCOUNTER — Encounter (HOSPITAL_BASED_OUTPATIENT_CLINIC_OR_DEPARTMENT_OTHER): Payer: 59 | Admitting: Oncology

## 2010-09-29 ENCOUNTER — Other Ambulatory Visit: Payer: Self-pay | Admitting: Oncology

## 2010-09-29 DIAGNOSIS — Z7901 Long term (current) use of anticoagulants: Secondary | ICD-10-CM

## 2010-09-29 DIAGNOSIS — Z86718 Personal history of other venous thrombosis and embolism: Secondary | ICD-10-CM

## 2010-09-29 LAB — PROTIME-INR
INR: 2.8 (ref 2.00–3.50)
Protime: 33.6 Seconds — ABNORMAL HIGH (ref 10.6–13.4)

## 2010-10-07 ENCOUNTER — Encounter (HOSPITAL_BASED_OUTPATIENT_CLINIC_OR_DEPARTMENT_OTHER): Payer: 59 | Admitting: Oncology

## 2010-10-07 ENCOUNTER — Other Ambulatory Visit: Payer: Self-pay | Admitting: Family Medicine

## 2010-10-07 ENCOUNTER — Other Ambulatory Visit: Payer: Self-pay | Admitting: Oncology

## 2010-10-07 DIAGNOSIS — Z1231 Encounter for screening mammogram for malignant neoplasm of breast: Secondary | ICD-10-CM

## 2010-10-07 DIAGNOSIS — Z7901 Long term (current) use of anticoagulants: Secondary | ICD-10-CM

## 2010-10-07 DIAGNOSIS — D473 Essential (hemorrhagic) thrombocythemia: Secondary | ICD-10-CM

## 2010-10-07 DIAGNOSIS — Z86718 Personal history of other venous thrombosis and embolism: Secondary | ICD-10-CM

## 2010-10-07 LAB — PROTIME-INR: INR: 3.1 (ref 2.00–3.50)

## 2010-10-15 ENCOUNTER — Encounter (HOSPITAL_BASED_OUTPATIENT_CLINIC_OR_DEPARTMENT_OTHER): Payer: 59 | Admitting: Oncology

## 2010-10-15 ENCOUNTER — Other Ambulatory Visit: Payer: Self-pay | Admitting: Oncology

## 2010-10-15 DIAGNOSIS — D473 Essential (hemorrhagic) thrombocythemia: Secondary | ICD-10-CM

## 2010-10-15 DIAGNOSIS — Z7901 Long term (current) use of anticoagulants: Secondary | ICD-10-CM

## 2010-10-15 DIAGNOSIS — Z86718 Personal history of other venous thrombosis and embolism: Secondary | ICD-10-CM

## 2010-10-15 LAB — PROTIME-INR

## 2010-10-27 ENCOUNTER — Other Ambulatory Visit: Payer: Self-pay | Admitting: Oncology

## 2010-10-27 ENCOUNTER — Encounter (HOSPITAL_BASED_OUTPATIENT_CLINIC_OR_DEPARTMENT_OTHER): Payer: 59 | Admitting: Oncology

## 2010-10-27 DIAGNOSIS — Z5181 Encounter for therapeutic drug level monitoring: Secondary | ICD-10-CM

## 2010-10-27 DIAGNOSIS — Z86718 Personal history of other venous thrombosis and embolism: Secondary | ICD-10-CM

## 2010-10-27 DIAGNOSIS — Z7901 Long term (current) use of anticoagulants: Secondary | ICD-10-CM

## 2010-10-27 LAB — PROTIME-INR: Protime: 31.2 Seconds — ABNORMAL HIGH (ref 10.6–13.4)

## 2010-11-06 ENCOUNTER — Ambulatory Visit
Admission: RE | Admit: 2010-11-06 | Discharge: 2010-11-06 | Disposition: A | Discharge status: Home / Self Care | Payer: 59 | Source: Ambulatory Visit | Attending: Family Medicine | Admitting: Family Medicine

## 2010-11-06 DIAGNOSIS — Z1231 Encounter for screening mammogram for malignant neoplasm of breast: Secondary | ICD-10-CM

## 2010-11-11 ENCOUNTER — Encounter (HOSPITAL_BASED_OUTPATIENT_CLINIC_OR_DEPARTMENT_OTHER): Payer: 59 | Admitting: Oncology

## 2010-11-11 ENCOUNTER — Other Ambulatory Visit: Payer: Self-pay | Admitting: Oncology

## 2010-11-11 DIAGNOSIS — Z7901 Long term (current) use of anticoagulants: Secondary | ICD-10-CM

## 2010-11-11 DIAGNOSIS — I82409 Acute embolism and thrombosis of unspecified deep veins of unspecified lower extremity: Secondary | ICD-10-CM

## 2010-11-11 DIAGNOSIS — Z86718 Personal history of other venous thrombosis and embolism: Secondary | ICD-10-CM

## 2010-11-11 DIAGNOSIS — D473 Essential (hemorrhagic) thrombocythemia: Secondary | ICD-10-CM

## 2010-11-11 LAB — PROTIME-INR: INR: 3.2 (ref 2.00–3.50)

## 2010-11-13 ENCOUNTER — Encounter: Payer: Self-pay | Admitting: Oncology

## 2010-11-13 DIAGNOSIS — I82409 Acute embolism and thrombosis of unspecified deep veins of unspecified lower extremity: Secondary | ICD-10-CM | POA: Insufficient documentation

## 2010-12-01 ENCOUNTER — Other Ambulatory Visit: Payer: Self-pay | Admitting: Oncology

## 2010-12-01 ENCOUNTER — Encounter: Payer: Self-pay | Admitting: *Deleted

## 2010-12-01 ENCOUNTER — Ambulatory Visit (HOSPITAL_BASED_OUTPATIENT_CLINIC_OR_DEPARTMENT_OTHER): Payer: 59 | Admitting: Oncology

## 2010-12-01 ENCOUNTER — Telehealth: Payer: Self-pay | Admitting: Oncology

## 2010-12-01 DIAGNOSIS — D473 Essential (hemorrhagic) thrombocythemia: Secondary | ICD-10-CM

## 2010-12-01 DIAGNOSIS — Z7901 Long term (current) use of anticoagulants: Secondary | ICD-10-CM

## 2010-12-01 DIAGNOSIS — Z86718 Personal history of other venous thrombosis and embolism: Secondary | ICD-10-CM

## 2010-12-01 DIAGNOSIS — I82409 Acute embolism and thrombosis of unspecified deep veins of unspecified lower extremity: Secondary | ICD-10-CM

## 2010-12-01 LAB — COMPREHENSIVE METABOLIC PANEL
ALT: 22 U/L (ref 0–35)
AST: 22 U/L (ref 0–37)
Alkaline Phosphatase: 127 U/L — ABNORMAL HIGH (ref 39–117)
Creatinine, Ser: 0.7 mg/dL (ref 0.50–1.10)
Total Bilirubin: 0.7 mg/dL (ref 0.3–1.2)

## 2010-12-01 LAB — CBC WITH DIFFERENTIAL/PLATELET
BASO%: 0.8 % (ref 0.0–2.0)
Basophils Absolute: 0 10*3/uL (ref 0.0–0.1)
EOS%: 1.4 % (ref 0.0–7.0)
HGB: 12.8 g/dL (ref 11.6–15.9)
MCH: 28.8 pg (ref 25.1–34.0)
MCHC: 32.8 g/dL (ref 31.5–36.0)
MONO#: 0.4 10*3/uL (ref 0.1–0.9)
RDW: 14.9 % — ABNORMAL HIGH (ref 11.2–14.5)
WBC: 5.1 10*3/uL (ref 3.9–10.3)
lymph#: 1.6 10*3/uL (ref 0.9–3.3)

## 2010-12-01 LAB — PROTIME-INR: INR: 3.4 (ref 2.00–3.50)

## 2010-12-01 NOTE — Telephone Encounter (Signed)
gv pt appt schedule for nov/feb. Per 10/29 pof lb/coumadin clinic 3wks + lb/HH 4mos.

## 2010-12-15 ENCOUNTER — Other Ambulatory Visit: Payer: Self-pay | Admitting: Pharmacist

## 2010-12-15 DIAGNOSIS — I82403 Acute embolism and thrombosis of unspecified deep veins of lower extremity, bilateral: Secondary | ICD-10-CM | POA: Insufficient documentation

## 2010-12-15 DIAGNOSIS — I82409 Acute embolism and thrombosis of unspecified deep veins of unspecified lower extremity: Secondary | ICD-10-CM

## 2010-12-22 ENCOUNTER — Ambulatory Visit: Payer: 59

## 2010-12-22 ENCOUNTER — Other Ambulatory Visit (HOSPITAL_BASED_OUTPATIENT_CLINIC_OR_DEPARTMENT_OTHER): Payer: 59 | Admitting: Lab

## 2010-12-22 DIAGNOSIS — I82409 Acute embolism and thrombosis of unspecified deep veins of unspecified lower extremity: Secondary | ICD-10-CM

## 2010-12-22 LAB — POCT INR: INR: 2.5

## 2010-12-22 NOTE — Progress Notes (Unsigned)
No complaints besides earlier bleeding episode when she bit her cheek, and then picked away the ripped tissue.  Will be traveling to help family in [place], so will schedule next visit after her trip. INR therapeutic.  Will continue current dose.

## 2010-12-23 ENCOUNTER — Telehealth: Payer: Self-pay | Admitting: Oncology

## 2010-12-23 NOTE — Telephone Encounter (Signed)
S/w the pt and she is aware of her appts on 01/21/2011@3 :00pm for the coumadin clinic

## 2011-01-21 ENCOUNTER — Ambulatory Visit: Payer: 59

## 2011-01-21 ENCOUNTER — Other Ambulatory Visit (HOSPITAL_BASED_OUTPATIENT_CLINIC_OR_DEPARTMENT_OTHER): Payer: 59

## 2011-01-21 ENCOUNTER — Ambulatory Visit (HOSPITAL_BASED_OUTPATIENT_CLINIC_OR_DEPARTMENT_OTHER): Payer: 59 | Admitting: Pharmacist

## 2011-01-21 DIAGNOSIS — I82409 Acute embolism and thrombosis of unspecified deep veins of unspecified lower extremity: Secondary | ICD-10-CM

## 2011-01-21 LAB — PROTIME-INR

## 2011-01-21 NOTE — Progress Notes (Signed)
INR slightly subtherapeutic.  Since her last INR value was also on the lower end of therapeutic, will increase dose slightly to 12.5mg  daily except 15mg  on MWF.  Pt doing well otherwise.  Recheck INR in 2 weeks.

## 2011-02-05 ENCOUNTER — Ambulatory Visit: Payer: Self-pay | Admitting: Oncology

## 2011-02-05 ENCOUNTER — Other Ambulatory Visit (HOSPITAL_BASED_OUTPATIENT_CLINIC_OR_DEPARTMENT_OTHER): Payer: 59 | Admitting: Lab

## 2011-02-05 ENCOUNTER — Ambulatory Visit: Payer: 59

## 2011-02-05 ENCOUNTER — Other Ambulatory Visit: Payer: Self-pay | Admitting: Oncology

## 2011-02-05 DIAGNOSIS — I82409 Acute embolism and thrombosis of unspecified deep veins of unspecified lower extremity: Secondary | ICD-10-CM

## 2011-02-05 DIAGNOSIS — Z7901 Long term (current) use of anticoagulants: Secondary | ICD-10-CM

## 2011-02-05 LAB — POCT INR: INR: 2

## 2011-02-05 LAB — PROTIME-INR

## 2011-02-05 NOTE — Patient Instructions (Signed)
Increase dose to 15 mg daily with 12.5 mg on Mon and Fri.  RTC in 02/20/11 at 9am.

## 2011-02-05 NOTE — Progress Notes (Signed)
Increase dose to 15 mg daily with 12.5 mg on Mon and Fri.  RTC in 2 weeks on 03/05/11 at 1pm.

## 2011-02-20 ENCOUNTER — Ambulatory Visit: Payer: 59

## 2011-02-20 ENCOUNTER — Other Ambulatory Visit (HOSPITAL_BASED_OUTPATIENT_CLINIC_OR_DEPARTMENT_OTHER): Payer: 59 | Admitting: Lab

## 2011-02-20 DIAGNOSIS — I82409 Acute embolism and thrombosis of unspecified deep veins of unspecified lower extremity: Secondary | ICD-10-CM

## 2011-02-20 LAB — PROTIME-INR
INR: 3 (ref 2.00–3.50)
Protime: 36 Seconds — ABNORMAL HIGH (ref 10.6–13.4)

## 2011-02-20 NOTE — Progress Notes (Unsigned)
Pt doing well except she has had a recent cold.  She has used Nyquil (containes some alcohol) at night.  May have a slight effect on the INR being increased today. INR still within range so we'll keep dose at 15 mg/day; 12.5 mg on Mon/Fri. Return in 2 weeks. Marily Lente, Pharm.D.

## 2011-03-06 ENCOUNTER — Ambulatory Visit (HOSPITAL_BASED_OUTPATIENT_CLINIC_OR_DEPARTMENT_OTHER): Payer: Self-pay | Admitting: Pharmacist

## 2011-03-06 ENCOUNTER — Other Ambulatory Visit (HOSPITAL_BASED_OUTPATIENT_CLINIC_OR_DEPARTMENT_OTHER): Payer: 59 | Admitting: Lab

## 2011-03-06 DIAGNOSIS — I82409 Acute embolism and thrombosis of unspecified deep veins of unspecified lower extremity: Secondary | ICD-10-CM

## 2011-03-06 NOTE — Progress Notes (Signed)
INR = 3.5 (goal = 2.5-3.5) Pt prefers that her INR be less than 3.5; she is afraid of having another hemorrhage episode, understandably. Therefore, at her request, I will change her dose of Coumadin to 15 mg/day; 12.5 mg MWF (this adds an additional day of 12.5 mg) She will be traveling next week so will come back for protime on 03/17/11. Marily Lente, Pharm.D.

## 2011-03-16 ENCOUNTER — Other Ambulatory Visit: Payer: Self-pay | Admitting: Pharmacist

## 2011-03-16 DIAGNOSIS — I82409 Acute embolism and thrombosis of unspecified deep veins of unspecified lower extremity: Secondary | ICD-10-CM

## 2011-03-17 ENCOUNTER — Other Ambulatory Visit: Payer: Self-pay | Admitting: Oncology

## 2011-03-17 ENCOUNTER — Ambulatory Visit (HOSPITAL_BASED_OUTPATIENT_CLINIC_OR_DEPARTMENT_OTHER): Payer: 59 | Admitting: Pharmacist

## 2011-03-17 ENCOUNTER — Encounter: Payer: Self-pay | Admitting: Pharmacist

## 2011-03-17 ENCOUNTER — Ambulatory Visit (HOSPITAL_COMMUNITY)
Admission: RE | Admit: 2011-03-17 | Discharge: 2011-03-17 | Disposition: A | Discharge status: Home / Self Care | Payer: 59 | Source: Ambulatory Visit | Attending: Oncology | Admitting: Oncology

## 2011-03-17 ENCOUNTER — Other Ambulatory Visit: Payer: 59 | Admitting: Lab

## 2011-03-17 ENCOUNTER — Telehealth: Payer: Self-pay | Admitting: Oncology

## 2011-03-17 DIAGNOSIS — M7989 Other specified soft tissue disorders: Secondary | ICD-10-CM | POA: Insufficient documentation

## 2011-03-17 DIAGNOSIS — I82409 Acute embolism and thrombosis of unspecified deep veins of unspecified lower extremity: Secondary | ICD-10-CM

## 2011-03-17 DIAGNOSIS — M79609 Pain in unspecified limb: Secondary | ICD-10-CM

## 2011-03-17 DIAGNOSIS — Z86718 Personal history of other venous thrombosis and embolism: Secondary | ICD-10-CM | POA: Insufficient documentation

## 2011-03-17 LAB — PROTIME-INR
INR: 3.3 (ref 2.00–3.50)
Protime: 39.6 s — ABNORMAL HIGH (ref 10.6–13.4)

## 2011-03-17 LAB — POCT INR: INR: 3.3

## 2011-03-17 NOTE — Progress Notes (Signed)
VASCULAR LAB PRELIMINARY  PRELIMINARY  PRELIMINARY  PRELIMINARY  Right lower extremity venous duplex completed.    Preliminary report:  Right:  No evidence of DVT, superficial thrombosis, or Baker's cyst.  Terance Hart, RVT 03/17/2011, 1:43 PM

## 2011-03-17 NOTE — Progress Notes (Signed)
Noted Doppler negative for DVT. I called pt at home & she was already aware of result. Pt seeing Dr. Gaylyn Rong on 03/30/11.  He ordered INR for pt that day.  I did not schedule her for Coumadin clinic that day.  Pt aware. Pharmacist can f/u INR on 03/30/11 & if stable, we can move 04/09/11 appt for Coumadin clinic out to late March. Marily Lente, Pharm.D.

## 2011-03-17 NOTE — Telephone Encounter (Signed)
Gv pt appt for [id number].  scheduled pt for doppler 03/17/2011 @ [facility].

## 2011-03-17 NOTE — Progress Notes (Signed)
Pt had sxs of R leg/thigh tightness during last flight.  The tightness is less but still present in R leg.   I have d/w Dr. Gaylyn Rong today & pt will need doppler to r/o new clot. For now since INR is therapeutic, we will keep Coumadin dose at 15 mg/day; 12.5 mg MWF. Return in 3 weeks unless doppler is positive. Marily Lente, Pharm.D.

## 2011-03-30 ENCOUNTER — Other Ambulatory Visit (HOSPITAL_BASED_OUTPATIENT_CLINIC_OR_DEPARTMENT_OTHER): Payer: 59 | Admitting: Lab

## 2011-03-30 ENCOUNTER — Ambulatory Visit (HOSPITAL_BASED_OUTPATIENT_CLINIC_OR_DEPARTMENT_OTHER): Payer: 59 | Admitting: Oncology

## 2011-03-30 VITALS — BP 121/84 | HR 65 | Temp 96.9°F | Ht 74.0 in | Wt 281.0 lb

## 2011-03-30 DIAGNOSIS — I82409 Acute embolism and thrombosis of unspecified deep veins of unspecified lower extremity: Secondary | ICD-10-CM

## 2011-03-30 DIAGNOSIS — Z7901 Long term (current) use of anticoagulants: Secondary | ICD-10-CM

## 2011-03-30 LAB — COMPREHENSIVE METABOLIC PANEL
ALT: 24 U/L (ref 0–35)
Albumin: 4.1 g/dL (ref 3.5–5.2)
CO2: 28 mEq/L (ref 19–32)
Calcium: 9.3 mg/dL (ref 8.4–10.5)
Chloride: 101 mEq/L (ref 96–112)
Glucose, Bld: 95 mg/dL (ref 70–99)
Potassium: 4.2 mEq/L (ref 3.5–5.3)
Sodium: 138 mEq/L (ref 135–145)
Total Protein: 6.4 g/dL (ref 6.0–8.3)

## 2011-03-30 LAB — CBC WITH DIFFERENTIAL/PLATELET
Eosinophils Absolute: 0.1 10*3/uL (ref 0.0–0.5)
LYMPH%: 20.4 % (ref 14.0–49.7)
MONO#: 0.6 10*3/uL (ref 0.1–0.9)
NEUT#: 6.8 10*3/uL — ABNORMAL HIGH (ref 1.5–6.5)
Platelets: 278 10*3/uL (ref 145–400)
RBC: 4.57 10*6/uL (ref 3.70–5.45)
RDW: 15 % — ABNORMAL HIGH (ref 11.2–14.5)
WBC: 9.4 10*3/uL (ref 3.9–10.3)
lymph#: 1.9 10*3/uL (ref 0.9–3.3)

## 2011-03-30 LAB — PROTIME-INR
INR: 4.1 — ABNORMAL HIGH (ref 2.00–3.50)
Protime: 49.2 Seconds — ABNORMAL HIGH (ref 10.6–13.4)

## 2011-03-30 NOTE — Progress Notes (Signed)
[facility] OFFICE PROGRESS NOTE  Cc:  Hollice Espy, MD, MD  DIAGNOSIS:  recurrent DVT.  CURRENT THERAPY:  lifelong anticoagulation with Coumadin, goal INR 2.5-3.5.  Currently, she is taking Coumadin 12.5mg  PO daily on Mon/Wed/Fri and 15mg  PO daily the rest of the week.   INTERVAL HISTORY: Rachel Yoder 51 y.o. female returns for regular follow up.  She has been traveling quite extensively.  About 2 weeks ago, she got back from a long flight, she complained of transient chest pressure which lasted for a few minutes which spontaneously resolved.  However, she also had right calf pain and swelling that lasted for a few days.  She was seen by coumadin clinic who relayed these symptoms to me.  We requested a doppler US on 03/17/11 which showed no evidence of acute deep vein or superficial thrombosis involving the right lower extremity. Chronic changes noted in the femoral and popliteal veins.  Since her chest pressure resolved, no chest imaging was requested.  She has been doing well without any of these residual symptoms.  Her right calf has always been a little more red than the left one ever since she had DVT.  She is taking Coumadin as instructed without missed dose.  She has refrained as much as possible from eating green leafy vegetables.  She does eat carrots, corn, and nuts.   Patient denies fatigue, headache, visual changes, confusion, drenching night sweats, palpable lymph node swelling, mucositis, odynophagia, dysphagia, nausea vomiting, jaundice, chest pain, palpitation, shortness of breath, dyspnea on exertion, productive cough, gum bleeding, epistaxis, hematemesis, hemoptysis, abdominal pain, abdominal swelling, early satiety, melena, hematochezia, hematuria, skin rash, spontaneous bleeding, joint swelling, joint pain, heat or cold intolerance, bowel bladder incontinence, back pain, focal motor weakness, paresthesia, depression, suicidal or homocidal ideation, feeling  hopelessness.    Past Medical History  Diagnosis Date  . DVT (deep venous thrombosis)     on Coumadin    No past surgical history on file.  Current Outpatient Prescriptions  Medication Sig Dispense Refill  . calcium carbonate (TUMS - DOSED IN MG ELEMENTAL CALCIUM) 500 MG chewable tablet Chew 1 tablet by mouth 2 (two) times daily.        . Ferrous Sulfate (IRON) 28 MG TABS Take 1 tablet by mouth daily.        . polycarbophil (FIBERCON) 625 MG tablet Take 1 tablet by mouth 2 (two) times daily.        . vitamin B-12 (CYANOCOBALAMIN) 500 MCG tablet Take 500 mcg by mouth every other day.        . Vitamin D, Ergocalciferol, (DRISDOL) 50000 UNITS CAPS Take 50,000 Units by mouth Once a week.      . warfarin (COUMADIN) 5 MG tablet Take 5 mg by mouth as directed. Dose changes as directed by Coumadin Clinic       . zinc gluconate 50 MG tablet Take 50 mg by mouth daily.          ALLERGIES:   has no known allergies.  REVIEW OF SYSTEMS:  The rest of the 14-point review of system was negative.   Filed Vitals:   03/30/11 1339  BP: 121/84  Pulse: 65  Temp: 96.9 F (36.1 C)   Wt Readings from Last 3 Encounters:  03/30/11 281 lb (127.461 kg)  12/01/10 276 lb (125.193 kg)   ECOG Performance status: 0  PHYSICAL EXAMINATION:   General:  well-nourished in no acute distress.  Eyes:  no scleral icterus.  ENT:  There were no oropharyngeal lesions.  Neck was without thyromegaly.  Lymphatics:  Negative cervical, supraclavicular or axillary adenopathy.  Respiratory: lungs were clear bilaterally without wheezing or crackles.  Cardiovascular:  Regular rate and rhythm, S1/S2, without murmur, rub or gallop.  There was no pedal edema.  GI:  abdomen was soft, flat, nontender, nondistended, without organomegaly.  Muscoloskeletal:  no spinal tenderness of palpation of vertebral spine.  Right leg was slightly more red than the left leg but without increased warmth, pain, edema.  Skin exam was without echymosis,  petichae.  Neuro exam was nonfocal.  Patient was able to get on and off exam table without assistance.  Gait was normal.  Patient was alerted and oriented.  Attention was good.   Language was appropriate.  Mood was normal without depression.  Speech was not pressured.  Thought content was not tangential.    LABORATORY/RADIOLOGY DATA:  Lab Results  Component Value Date   WBC 9.4 03/30/2011   HGB 13.2 03/30/2011   HCT 40.7 03/30/2011   PLT 278 03/30/2011   GLUCOSE 95 03/30/2011   ALT 24 03/30/2011   AST 24 03/30/2011   NA 138 03/30/2011   K 4.2 03/30/2011   CL 101 03/30/2011   CREATININE 0.71 03/30/2011   BUN 12 03/30/2011   CO2 28 03/30/2011   INR 4.10* 03/30/2011    ASSESSMENT AND PLAN:   1. History of limb threatening deep venous thrombosis:  I advised Ms. Steib to maintain Coumadin lifelong given the severity of her recurrent thrombosis in the past.  In the future, if the new anticoagulation such as direct thrombin and direct factor X  inhibitors have antidotes, I may consider switching.  However, given the fact that they have no antidote and she history of severe hemorrhage  in the past, I would not want to try anything them but continuing Coumadin at this time.  Her INR today is elevated.  I advised her to change to Coumadin regimen to:  15mg  on Sat/Sun; and 12.5mg  the rest of the week.  She has appointment with Coumadin clinic in about 10 days.  I will see her in about 3 months.  2. Primary care:  She said that she is up to date with mammogram.  Colonoscopy was 10 years ago when she was 69 and the next one is when she turns 58.  Pap smear was negative in February 2012.  She already got the influenza vaccination in September 2012.

## 2011-04-09 ENCOUNTER — Other Ambulatory Visit (HOSPITAL_BASED_OUTPATIENT_CLINIC_OR_DEPARTMENT_OTHER): Payer: 59

## 2011-04-09 ENCOUNTER — Ambulatory Visit (HOSPITAL_BASED_OUTPATIENT_CLINIC_OR_DEPARTMENT_OTHER): Payer: 59 | Admitting: Pharmacist

## 2011-04-09 DIAGNOSIS — I82409 Acute embolism and thrombosis of unspecified deep veins of unspecified lower extremity: Secondary | ICD-10-CM

## 2011-04-09 LAB — PROTIME-INR
INR: 2.7 (ref 2.00–3.50)
Protime: 32.4 Seconds — ABNORMAL HIGH (ref 10.6–13.4)

## 2011-04-09 NOTE — Progress Notes (Signed)
INR therapeutic today (2.7).  Pt decreased her dose as instructed by Dr. Gaylyn Rong at her last visit to 12.5mg  daily except 15mg  on Sa Su.  No missed doses, no problems with bleeding or bruising.  Only med change was that her PCP instructed her to increase her iron supplement to 2 tabs PO daily.  Medlist updated.  Will have pt continue current dose, and recheck INR in 1 week to ensure that her INR does not decrease further.

## 2011-04-16 ENCOUNTER — Ambulatory Visit (HOSPITAL_BASED_OUTPATIENT_CLINIC_OR_DEPARTMENT_OTHER): Payer: 59 | Admitting: Pharmacist

## 2011-04-16 ENCOUNTER — Other Ambulatory Visit (HOSPITAL_BASED_OUTPATIENT_CLINIC_OR_DEPARTMENT_OTHER): Payer: 59 | Admitting: Lab

## 2011-04-16 DIAGNOSIS — I82409 Acute embolism and thrombosis of unspecified deep veins of unspecified lower extremity: Secondary | ICD-10-CM

## 2011-04-16 LAB — PROTIME-INR: Protime: 28.8 Seconds — ABNORMAL HIGH (ref 10.6–13.4)

## 2011-04-16 NOTE — Patient Instructions (Signed)
Slightly increase your coumadin dose to 12.5mg  daily except 15mg  on MWF. Recheck your INR in 2 weeks, 04/30/11 @ 9am.

## 2011-04-16 NOTE — Progress Notes (Signed)
Pt has been taking 12.5mg  daily except 15mg  on Sa & Su since 03/30/11 (about 2 and 1/2 weeks).This = 92.5mg /wk. No changes in diet, medications, etc. No problems to report.  Patient INR was above goal on 97.5mg /wk (=15mg  daily except 12.5mg  on MWF). INR has been decreasing and is borderline low today. Will try to keep INR closer to 3. Will slightly increase dose to 12.5mg  daily except 15mg  on MWF (95mg /wk). Recheck INR in 2 weeks.

## 2011-04-30 ENCOUNTER — Ambulatory Visit: Payer: 59 | Admitting: Pharmacist

## 2011-04-30 ENCOUNTER — Other Ambulatory Visit (HOSPITAL_BASED_OUTPATIENT_CLINIC_OR_DEPARTMENT_OTHER): Payer: 59 | Admitting: Lab

## 2011-04-30 DIAGNOSIS — I82409 Acute embolism and thrombosis of unspecified deep veins of unspecified lower extremity: Secondary | ICD-10-CM

## 2011-04-30 DIAGNOSIS — Z7901 Long term (current) use of anticoagulants: Secondary | ICD-10-CM

## 2011-04-30 DIAGNOSIS — Z5181 Encounter for therapeutic drug level monitoring: Secondary | ICD-10-CM

## 2011-04-30 LAB — PROTIME-INR
INR: 2.7 (ref 2.00–3.50)
Protime: 32.4 Seconds — ABNORMAL HIGH (ref 10.6–13.4)

## 2011-04-30 LAB — POCT INR: INR: 2.7

## 2011-04-30 NOTE — Patient Instructions (Signed)
Continue same dose of 15 mg M, W, F and 12.5 mg on other days.  Recheck INR in 2 weeks (05/15/11 at 9am)

## 2011-05-15 ENCOUNTER — Ambulatory Visit: Payer: 59 | Admitting: Pharmacist

## 2011-05-15 ENCOUNTER — Other Ambulatory Visit (HOSPITAL_BASED_OUTPATIENT_CLINIC_OR_DEPARTMENT_OTHER): Payer: 59 | Admitting: Lab

## 2011-05-15 DIAGNOSIS — I82409 Acute embolism and thrombosis of unspecified deep veins of unspecified lower extremity: Secondary | ICD-10-CM

## 2011-05-15 LAB — PROTIME-INR
INR: 3.5 (ref 2.00–3.50)
Protime: 42 Seconds — ABNORMAL HIGH (ref 10.6–13.4)

## 2011-05-15 NOTE — Progress Notes (Signed)
INR at goal of 2.5-3.5 but pt felt more comfortable slightly decreasing Coumadin dose by 2.5mg /week to 15mg  on M/F and 12.5mg  other days.  Will check PT/INR in 3 weeks.

## 2011-06-05 ENCOUNTER — Ambulatory Visit (HOSPITAL_BASED_OUTPATIENT_CLINIC_OR_DEPARTMENT_OTHER): Payer: 59 | Admitting: Pharmacist

## 2011-06-05 ENCOUNTER — Other Ambulatory Visit: Payer: 59 | Admitting: Lab

## 2011-06-05 DIAGNOSIS — I82409 Acute embolism and thrombosis of unspecified deep veins of unspecified lower extremity: Secondary | ICD-10-CM

## 2011-06-05 DIAGNOSIS — Z7901 Long term (current) use of anticoagulants: Secondary | ICD-10-CM

## 2011-06-05 LAB — POCT INR: INR: 2.7

## 2011-06-05 NOTE — Progress Notes (Signed)
INR = 2.7 on 12.5 mg/day; 15 mg M/F No complaints today. Cont same dose. Pt will return 6/7 (5 wks) & we can see her same day as MD appt w/ Dr. Marc Morgans, Pharm.D.

## 2011-07-10 ENCOUNTER — Other Ambulatory Visit (HOSPITAL_BASED_OUTPATIENT_CLINIC_OR_DEPARTMENT_OTHER): Payer: 59 | Admitting: Lab

## 2011-07-10 ENCOUNTER — Ambulatory Visit: Payer: 59 | Admitting: Pharmacist

## 2011-07-10 ENCOUNTER — Telehealth: Payer: Self-pay | Admitting: Oncology

## 2011-07-10 ENCOUNTER — Ambulatory Visit (HOSPITAL_BASED_OUTPATIENT_CLINIC_OR_DEPARTMENT_OTHER): Payer: 59 | Admitting: Oncology

## 2011-07-10 ENCOUNTER — Other Ambulatory Visit: Payer: Self-pay | Admitting: *Deleted

## 2011-07-10 VITALS — BP 115/78 | HR 62 | Temp 96.8°F | Ht 74.0 in | Wt 284.6 lb

## 2011-07-10 DIAGNOSIS — I82409 Acute embolism and thrombosis of unspecified deep veins of unspecified lower extremity: Secondary | ICD-10-CM

## 2011-07-10 DIAGNOSIS — Z7901 Long term (current) use of anticoagulants: Secondary | ICD-10-CM

## 2011-07-10 DIAGNOSIS — Z800 Family history of malignant neoplasm of digestive organs: Secondary | ICD-10-CM

## 2011-07-10 DIAGNOSIS — Z5181 Encounter for therapeutic drug level monitoring: Secondary | ICD-10-CM

## 2011-07-10 DIAGNOSIS — Z86718 Personal history of other venous thrombosis and embolism: Secondary | ICD-10-CM

## 2011-07-10 LAB — CBC WITH DIFFERENTIAL/PLATELET
BASO%: 1.2 % (ref 0.0–2.0)
Basophils Absolute: 0.1 10*3/uL (ref 0.0–0.1)
Eosinophils Absolute: 0.1 10*3/uL (ref 0.0–0.5)
HCT: 40.7 % (ref 34.8–46.6)
HGB: 13.5 g/dL (ref 11.6–15.9)
MONO#: 0.4 10*3/uL (ref 0.1–0.9)
NEUT%: 54.2 % (ref 38.4–76.8)
Platelets: 233 10*3/uL (ref 145–400)
WBC: 4.2 10*3/uL (ref 3.9–10.3)
lymph#: 1.4 10*3/uL (ref 0.9–3.3)

## 2011-07-10 LAB — COMPREHENSIVE METABOLIC PANEL
ALT: 19 U/L (ref 0–35)
AST: 17 U/L (ref 0–37)
Alkaline Phosphatase: 111 U/L (ref 39–117)
CO2: 33 mEq/L — ABNORMAL HIGH (ref 19–32)
Creatinine, Ser: 0.7 mg/dL (ref 0.50–1.10)
Sodium: 142 mEq/L (ref 135–145)
Total Bilirubin: 0.7 mg/dL (ref 0.3–1.2)
Total Protein: 6.2 g/dL (ref 6.0–8.3)

## 2011-07-10 MED ORDER — WARFARIN SODIUM 5 MG PO TABS: 5.0000 mg | ORAL_TABLET | ORAL | Status: DC

## 2011-07-10 NOTE — Progress Notes (Signed)
[facility]  [phone] [phone]   OFFICE PROGRESS NOTE   Cc:  Hollice Espy, MD, MD   DIAGNOSIS: recurrent DVT.   CURRENT THERAPY: lifelong anticoagulation with Coumadin, goal INR 2.5-3.5. Currently, she is taking Coumadin 12.5mg  PO daily except for 15mg  PO daily on Monday and Friday.   INTERVAL HISTORY: Rachel Yoder 51 y.o. female returns for regular follow up.  She is taking Coumadin without problem.  She reports strict adherent without missing dose.  She has not seen any visible source of bleeding. She still travels by air quite a bit.  She wears compression stocking regularly.  She denies bilateral calf pain or swelling. The previous site of bleeding and hematoma in the right arm and right breast has resolved completely.  Patient denies fatigue, headache, visual changes, confusion, drenching night sweats, palpable lymph node swelling, mucositis, odynophagia, dysphagia, nausea vomiting, jaundice, chest pain, palpitation, shortness of breath, dyspnea on exertion, productive cough, gum bleeding, epistaxis, hematemesis, hemoptysis, abdominal pain, abdominal swelling, early satiety, melena, hematochezia, hematuria, skin rash, spontaneous bleeding, joint swelling, joint pain, heat or cold intolerance, bowel bladder incontinence, back pain, focal motor weakness, paresthesia, depression, suicidal or homocidal ideation, feeling hopelessness.   Past Medical History  Diagnosis Date  . DVT (deep venous thrombosis)     on Coumadin    No past surgical history on file.  Current Outpatient Prescriptions  Medication Sig Dispense Refill  . calcium carbonate (TUMS - DOSED IN MG ELEMENTAL CALCIUM) 500 MG chewable tablet Chew 1 tablet by mouth 2 (two) times daily.        . Ferrous Sulfate (IRON) 28 MG TABS Take 2 tablets by mouth daily.       . polycarbophil (FIBERCON) 625 MG tablet Take 1 tablet by mouth 2 (two) times daily.        . vitamin B-12  (CYANOCOBALAMIN) 500 MCG tablet Take 500 mcg by mouth every other day.        . Vitamin D, Ergocalciferol, (DRISDOL) 50000 UNITS CAPS Take 50,000 Units by mouth Once a week.      . warfarin (COUMADIN) 5 MG tablet Take 5 mg by mouth as directed. Dose changes as directed by Coumadin Clinic       . zinc gluconate 50 MG tablet Take 50 mg by mouth daily.          ALLERGIES:   has no known allergies.  REVIEW OF SYSTEMS:  The rest of the 14-point review of system was negative.   Filed Vitals:   07/10/11 0920  BP: 115/78  Pulse: 62  Temp: 96.8 F (36 C)   Wt Readings from Last 3 Encounters:  07/10/11 284 lb 9.6 oz (129.094 kg)  03/30/11 281 lb (127.461 kg)  12/01/10 276 lb (125.193 kg)   ECOG Performance status: 0  PHYSICAL EXAMINATION:    General: well-nourished woman in no acute distress. Eyes: no scleral icterus. ENT: There were no oropharyngeal lesions. Neck was without thyromegaly. Lymphatics: Negative cervical, supraclavicular or axillary adenopathy. Respiratory: lungs were clear bilaterally without wheezing or crackles. Cardiovascular: Regular rate and rhythm, S1/S2, without murmur, rub or gallop. There was no pedal edema. GI: abdomen was soft, flat, nontender, nondistended, without organomegaly. Muscoloskeletal: no spinal tenderness of palpation of vertebral spine.  Skin exam was without echymosis, petichae. Neuro exam was nonfocal. Patient was able to get on and off exam table without assistance. Gait was normal. Patient was alerted and oriented. Attention was good. Language was appropriate.  Mood was normal without depression. Speech was not pressured. Thought content was not tangential.    LABORATORY/RADIOLOGY DATA:  Lab Results  Component Value Date   WBC 4.2 07/10/2011   HGB 13.5 07/10/2011   HCT 40.7 07/10/2011   PLT 233 07/10/2011   GLUCOSE 95 03/30/2011   ALKPHOS 129* 03/30/2011   ALT 24 03/30/2011   AST 24 03/30/2011   NA 138 03/30/2011   K 4.2 03/30/2011   CL 101 03/30/2011    CREATININE 0.71 03/30/2011   BUN 12 03/30/2011   CO2 28 03/30/2011   INR 3.80* 07/10/2011     ASSESSMENT AND PLAN:   1. History of limb threatening deep venous thrombosis: I advised Ms. Matera to maintain Coumadin lifelong given the severity of her recurrent thrombosis in the past. Her INR is slightly elevated today.  She denied any drastic change in diet or taking antibiotic.  I advised her to decrease Coumadin to 12.5mg  PO daily except for Monday at 15mg .  [facility].  In the future, if there is antidotes for rivaroxaban, I may consider switching.  2. Primary care: She said that she is up to date with mammogram. Colonoscopy was about 2 years ago and was negative.  However, her mother had colon cancer and thus she is due for colonoscopy every 5 years; next one is due in 2016.  Pap smear was negative in February 2012. She already got the influenza vaccination in September 2012. 3. Osteoporosis prophylaxis:  I advised her to take Calcium/Vit D since she is on lifelong Coumadin.     The length of time of the face-to-face encounter was . More than 50% of time was spent counseling and coordination of care.

## 2011-07-10 NOTE — Telephone Encounter (Signed)
appts made and printed for pt aom °

## 2011-07-10 NOTE — Progress Notes (Signed)
INR supratherapeutic today (3.8).  No problems with bleeding or bruising.  No significant changes in meds or diet per pt.  Will decrease her dose slightly to 12.5mg  daily except 15mg  on Mondays, and recheck INR in ~10 days after she returns from another business trip to [place].  She likely will drink some wine while she is there, but she does not plan to drink to excess.

## 2011-07-20 ENCOUNTER — Other Ambulatory Visit (HOSPITAL_BASED_OUTPATIENT_CLINIC_OR_DEPARTMENT_OTHER): Payer: 59 | Admitting: Lab

## 2011-07-20 ENCOUNTER — Ambulatory Visit (HOSPITAL_BASED_OUTPATIENT_CLINIC_OR_DEPARTMENT_OTHER): Payer: 59 | Admitting: Pharmacist

## 2011-07-20 DIAGNOSIS — I82409 Acute embolism and thrombosis of unspecified deep veins of unspecified lower extremity: Secondary | ICD-10-CM

## 2011-07-20 LAB — PROTIME-INR
INR: 3.3 (ref 2.00–3.50)
Protime: 39.6 Seconds — ABNORMAL HIGH (ref 10.6–13.4)

## 2011-07-20 NOTE — Progress Notes (Signed)
INR = 3.3 on 12.5 mg/day; 15 mg Mon. No complaints or complications re: anticoag. INR at goal so we will leave dose the same.   Again, pt will be doing some traveling at the end of June - July 2nd so we will see her in 3 weeks. Marily Lente, Pharm.D.

## 2011-07-23 ENCOUNTER — Telehealth: Payer: Self-pay | Admitting: Pharmacist

## 2011-07-23 NOTE — Telephone Encounter (Signed)
Pt called stating the her PCP has started he on doxycycline on 07/22/11 for 10 days for a sore that won't heal.  Next PT/INR appt is not until July.  Will schedule pt to come next week on 07/30/11 to evaluate potential drug interaction with doxycycline and warfarin.

## 2011-07-30 ENCOUNTER — Ambulatory Visit (HOSPITAL_BASED_OUTPATIENT_CLINIC_OR_DEPARTMENT_OTHER): Payer: 59 | Admitting: Pharmacist

## 2011-07-30 ENCOUNTER — Other Ambulatory Visit: Payer: 59 | Admitting: Lab

## 2011-07-30 DIAGNOSIS — I82409 Acute embolism and thrombosis of unspecified deep veins of unspecified lower extremity: Secondary | ICD-10-CM

## 2011-07-30 LAB — PROTIME-INR
INR: 3.5 (ref 2.00–3.50)
Protime: 42 Seconds — ABNORMAL HIGH (ref 10.6–13.4)

## 2011-07-30 NOTE — Progress Notes (Signed)
Continue same dose = 12.5mg daily except 15mg on Monday.   Check PT/INR in 08/10/11 at 8:45 am for lab; 9 am for Coumadin clinic. 

## 2011-07-30 NOTE — Patient Instructions (Addendum)
Continue same dose = 12.5mg  daily except 15mg  on Monday.   Check PT/INR in 08/10/11 at 8:45 am for lab; 9 am for Coumadin clinic.

## 2011-08-10 ENCOUNTER — Ambulatory Visit (HOSPITAL_BASED_OUTPATIENT_CLINIC_OR_DEPARTMENT_OTHER): Payer: 59 | Admitting: Pharmacist

## 2011-08-10 ENCOUNTER — Other Ambulatory Visit (HOSPITAL_BASED_OUTPATIENT_CLINIC_OR_DEPARTMENT_OTHER): Payer: 59

## 2011-08-10 DIAGNOSIS — I82409 Acute embolism and thrombosis of unspecified deep veins of unspecified lower extremity: Secondary | ICD-10-CM

## 2011-08-10 LAB — PROTIME-INR: Protime: 37.2 Seconds — ABNORMAL HIGH (ref 10.6–13.4)

## 2011-08-10 NOTE — Progress Notes (Signed)
INR = 3.1 (goal = 2.5-3.5) on 12.5 mg/day; 15 mg Mon. Pt preparing for a minor dental implant procedure next week.  Her dentist wrote RXs for Amoxicillin (1 dose prior & 1 dose post), Motrin & Vicodin. Pt understands that Motrin may increase her risk of bleeding.  I recommended she take APAP or begin w/ a smaller dose of OTC Motrin (if she has to use it) & assess pain control.  She will call her dentist & find out if this is ok first.   There are no problems currently to report re: anticoag. INR fine today so we'll keep the dose the same.   Return next Monday for INR check before dental work.   Amoxicillin may increase INR so we may want to consider repeating INR 5-7 days after dental work. Marily Lente, Pharm.D.

## 2011-08-17 ENCOUNTER — Other Ambulatory Visit (HOSPITAL_BASED_OUTPATIENT_CLINIC_OR_DEPARTMENT_OTHER): Payer: 59 | Admitting: Lab

## 2011-08-17 ENCOUNTER — Ambulatory Visit (HOSPITAL_BASED_OUTPATIENT_CLINIC_OR_DEPARTMENT_OTHER): Payer: 59 | Admitting: Pharmacist

## 2011-08-17 ENCOUNTER — Telehealth: Payer: Self-pay | Admitting: Pharmacist

## 2011-08-17 DIAGNOSIS — I82409 Acute embolism and thrombosis of unspecified deep veins of unspecified lower extremity: Secondary | ICD-10-CM

## 2011-08-17 NOTE — Progress Notes (Signed)
Pt INR stable on current coumadin dose.  She has been on Coumadin 15mg  on Monday and 12.5mg  other days for a couple months.  Getting tooth implant tomorrow and dental surgeon aware pt on coumadin and pt has copy of INR.  Will cont current dose of coumadin and check PT/INR in 1 month.  Pt knows to call if anything changes prior to next coumadin clinic appt.

## 2011-08-17 NOTE — Telephone Encounter (Signed)
Pt called and stated that oral surgeon will not do procedure tomorrow unless INR<3.  Today's INR =3.2.  I instructed pt to hold today's dose and will check PT/INR at 8am tomorrow.  If INR < 3 will have dental procedure, otherwise will need to reschedule.

## 2011-08-18 ENCOUNTER — Other Ambulatory Visit (HOSPITAL_BASED_OUTPATIENT_CLINIC_OR_DEPARTMENT_OTHER): Payer: 59 | Admitting: Lab

## 2011-08-18 DIAGNOSIS — I82409 Acute embolism and thrombosis of unspecified deep veins of unspecified lower extremity: Secondary | ICD-10-CM

## 2011-08-18 LAB — PROTIME-INR
INR: 2.3 (ref 2.00–3.50)
Protime: 27.6 Seconds — ABNORMAL HIGH (ref 10.6–13.4)

## 2011-09-14 ENCOUNTER — Other Ambulatory Visit: Payer: 59 | Admitting: Lab

## 2011-09-14 ENCOUNTER — Other Ambulatory Visit (HOSPITAL_BASED_OUTPATIENT_CLINIC_OR_DEPARTMENT_OTHER): Payer: 59 | Admitting: Lab

## 2011-09-14 ENCOUNTER — Ambulatory Visit (HOSPITAL_BASED_OUTPATIENT_CLINIC_OR_DEPARTMENT_OTHER): Payer: 59 | Admitting: Pharmacist

## 2011-09-14 ENCOUNTER — Ambulatory Visit: Payer: 59 | Admitting: Oncology

## 2011-09-14 DIAGNOSIS — I82409 Acute embolism and thrombosis of unspecified deep veins of unspecified lower extremity: Secondary | ICD-10-CM

## 2011-09-14 LAB — PROTIME-INR: INR: 2.7 (ref 2.00–3.50)

## 2011-09-14 NOTE — Progress Notes (Signed)
Continue same dose = 12.5mg  daily except 15mg  on Monday.   Check PT/INR in 10/02/11 at 9 am for lab; 9:15 am for Coumadin clinic.

## 2011-09-14 NOTE — Patient Instructions (Signed)
Continue same dose = 12.5mg daily except 15mg on Monday.   Check PT/INR in 10/02/11 at 9 am for lab; 9:15 am for Coumadin clinic. 

## 2011-10-02 ENCOUNTER — Other Ambulatory Visit: Payer: Self-pay | Admitting: Family Medicine

## 2011-10-02 ENCOUNTER — Ambulatory Visit (HOSPITAL_BASED_OUTPATIENT_CLINIC_OR_DEPARTMENT_OTHER): Payer: 59 | Admitting: Pharmacist

## 2011-10-02 ENCOUNTER — Other Ambulatory Visit (HOSPITAL_BASED_OUTPATIENT_CLINIC_OR_DEPARTMENT_OTHER): Payer: 59 | Admitting: Lab

## 2011-10-02 DIAGNOSIS — I82409 Acute embolism and thrombosis of unspecified deep veins of unspecified lower extremity: Secondary | ICD-10-CM

## 2011-10-02 DIAGNOSIS — Z1231 Encounter for screening mammogram for malignant neoplasm of breast: Secondary | ICD-10-CM

## 2011-10-02 LAB — PROTIME-INR: INR: 3.6 — ABNORMAL HIGH (ref 2.00–3.50)

## 2011-10-02 NOTE — Progress Notes (Signed)
No changes.  INR at goal.  Pt insurance changing and will be using [facility] instead of [facility] beginning September 1.  Will call in new prescription for warfarin next week to [facility].  Coumadin samples given to avoid any lapse in [facility] sending warfarin refill.  Check PT/INR in 1 month.

## 2011-10-06 ENCOUNTER — Other Ambulatory Visit: Payer: Self-pay | Admitting: Pharmacist

## 2011-10-06 DIAGNOSIS — I82409 Acute embolism and thrombosis of unspecified deep veins of unspecified lower extremity: Secondary | ICD-10-CM

## 2011-10-06 MED ORDER — WARFARIN SODIUM 5 MG PO TABS: 5.0000 mg | ORAL_TABLET | ORAL | Status: DC

## 2011-10-06 NOTE — Telephone Encounter (Signed)
Warfarin rx called into [facility] [phone], pts new mail order pharmacy provider.  Left VM at pt home phone that this was done.

## 2011-10-30 ENCOUNTER — Ambulatory Visit (HOSPITAL_BASED_OUTPATIENT_CLINIC_OR_DEPARTMENT_OTHER): Payer: 59 | Admitting: Pharmacist

## 2011-10-30 ENCOUNTER — Other Ambulatory Visit (HOSPITAL_BASED_OUTPATIENT_CLINIC_OR_DEPARTMENT_OTHER): Payer: 59 | Admitting: Lab

## 2011-10-30 DIAGNOSIS — I82409 Acute embolism and thrombosis of unspecified deep veins of unspecified lower extremity: Secondary | ICD-10-CM

## 2011-10-30 LAB — PROTIME-INR
INR: 3.3 (ref 2.00–3.50)
Protime: 39.6 Seconds — ABNORMAL HIGH (ref 10.6–13.4)

## 2011-10-30 NOTE — Progress Notes (Signed)
INR therapeutic today (3.3) No changes in meds or diet.  No problems. Continue current dose of 12.5mg  daily except 15mg  on Monday. Recheck INR in 6 weeks.

## 2011-11-13 ENCOUNTER — Ambulatory Visit
Admission: RE | Admit: 2011-11-13 | Discharge: 2011-11-13 | Disposition: A | Discharge status: Home / Self Care | Payer: 59 | Source: Ambulatory Visit | Attending: Family Medicine | Admitting: Family Medicine

## 2011-11-13 DIAGNOSIS — Z1231 Encounter for screening mammogram for malignant neoplasm of breast: Secondary | ICD-10-CM

## 2011-12-11 ENCOUNTER — Ambulatory Visit (HOSPITAL_BASED_OUTPATIENT_CLINIC_OR_DEPARTMENT_OTHER): Payer: 59 | Admitting: Pharmacist

## 2011-12-11 ENCOUNTER — Other Ambulatory Visit (HOSPITAL_BASED_OUTPATIENT_CLINIC_OR_DEPARTMENT_OTHER): Payer: 59 | Admitting: Lab

## 2011-12-11 DIAGNOSIS — I82409 Acute embolism and thrombosis of unspecified deep veins of unspecified lower extremity: Secondary | ICD-10-CM

## 2011-12-11 LAB — PROTIME-INR: INR: 2.4 (ref 2.00–3.50)

## 2011-12-11 NOTE — Progress Notes (Signed)
INR = 2.4 on 12.5 mg/day; 15 mg on Mondays Pt had a "big fall" 2 weeks ago.  She was hauling feed on a flat bed cart in a parking lot & fell, hitting her legs on the cart.  She experienced bruising only, no cuts.  She used ice packs to relieve her pain.  She has healed from the bruising. No med changes, no diet alterations. INR a little low today but she has been stable on this same dose for quite some time (was last changed 04/16/11). We agreed to keep the dose of her Coumadin the same. Repeat INR at next MD visit on 01/08/12.  Rachel Yoder will call us if any problems arise before then. Marily Lente, Pharm.D.

## 2012-01-06 IMAGING — XA IR ANGIO/EXISTING CATHETER
10 of 15 series · 13 of 24 positions shown · non-contrast
Comparison: Previous day's exam

CLINICAL DATA: Bilateral lower extremity DVT extending into IVC
filter, status post 48 hours of catheter directed bilateral lower
extremity thrombolytic infusion

BILATERAL LOWER EXTREMITY AND PELVIC VENOGRAPHY
VENOUS ANGIOPLASTY X2

[Series 1: run · 3 of 92 slices shown (1 of 10)]
[im 1/92]
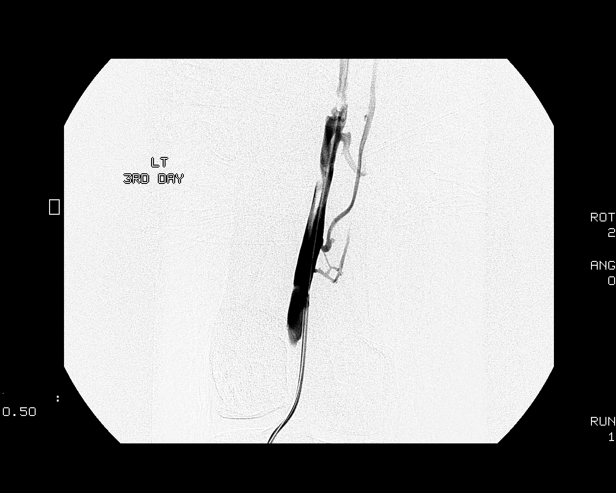
[im 46/92]
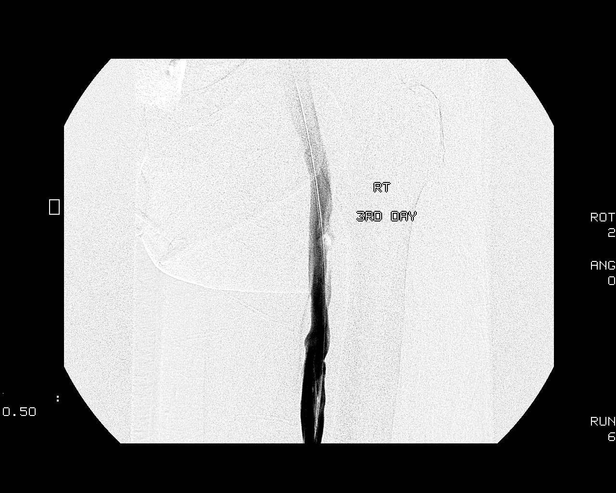
[im 92/92]
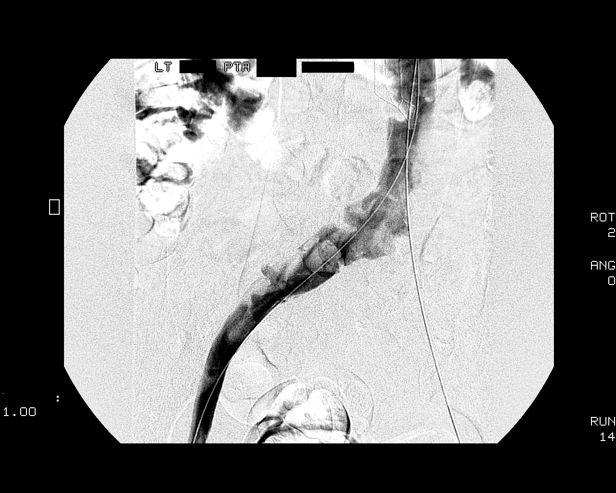

[Series 2: run · 1 of 17 slices shown (2 of 10)]
[im 1/17]
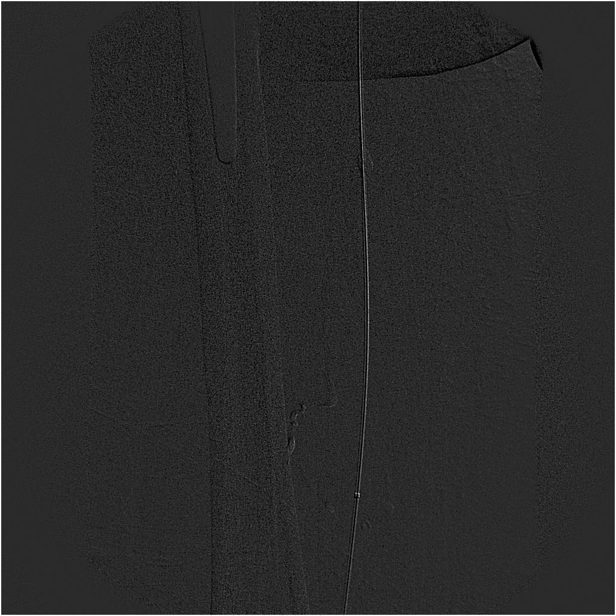

[Series 3: run · 1 of 27 slices shown (3 of 10)]
[im 27/27]
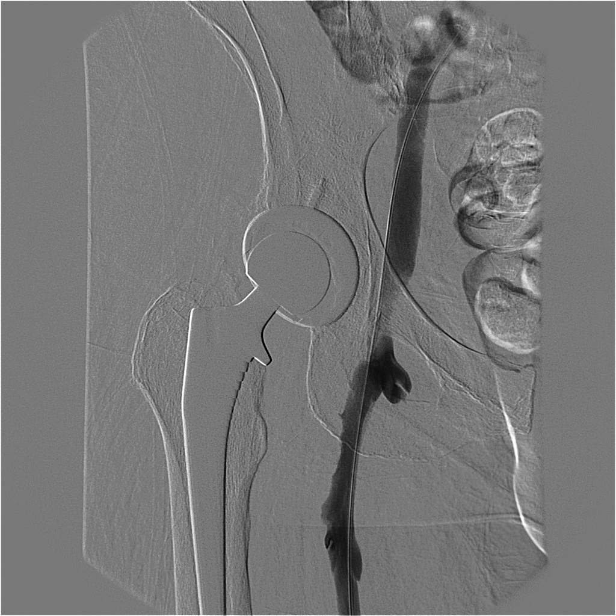

[Series 4: run · 1 of 48 slices shown (4 of 10)]
[im 24/48]
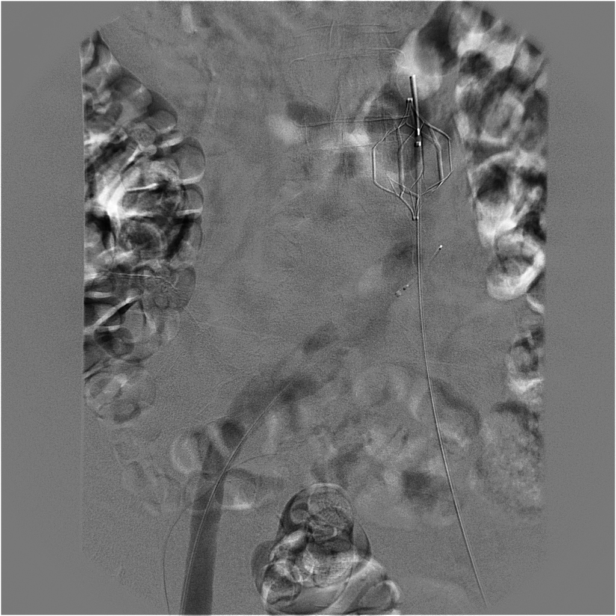

[Series 5: run · 1 of 11 slices shown (5 of 10)]
[im 1/11]
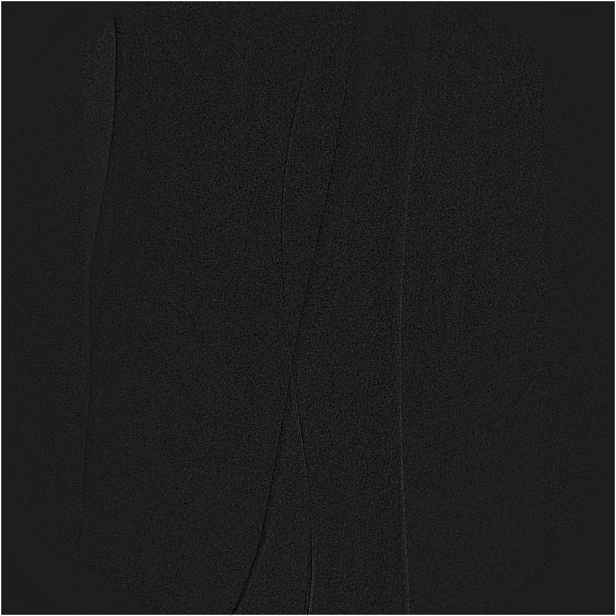

[Series 6: run · 1 of 16 slices shown (6 of 10)]
[im 1/16]
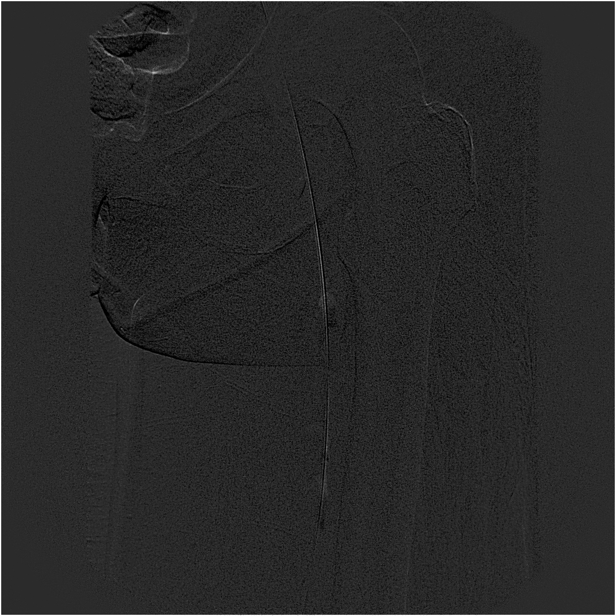

[Series 8: run · 2 of 43 slices shown (7 of 10)]
[im 1/43]
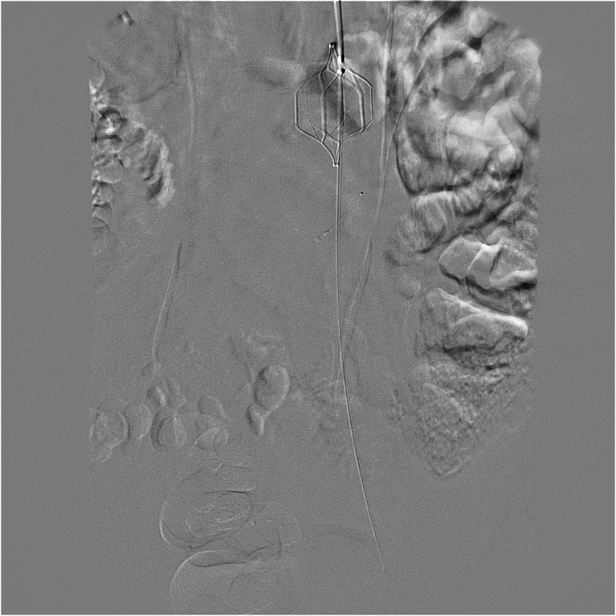
[im 43/43]
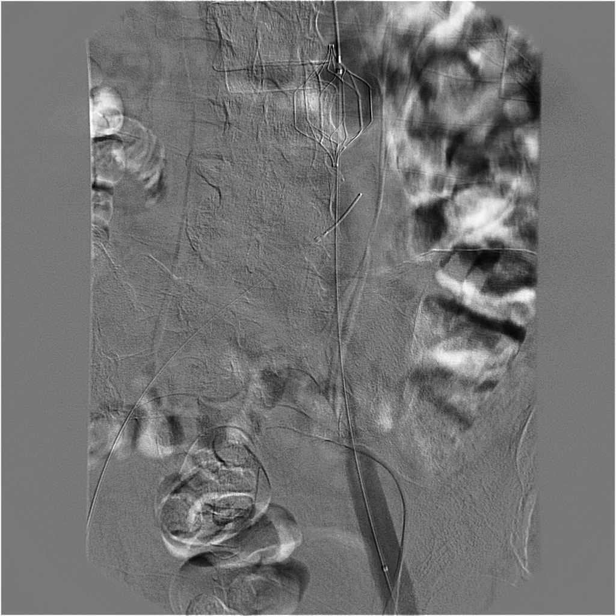

[Series 10: run · 1 of 10 slices shown (8 of 10)]
[im 1/10]
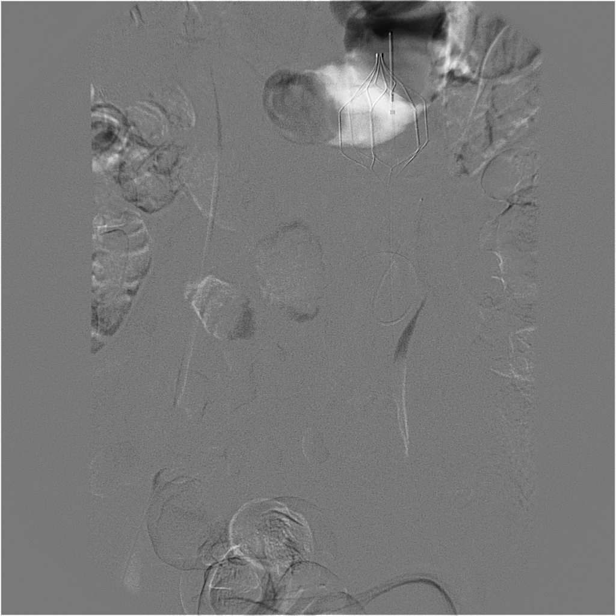

[Series 12: run · 1 of 7 slices shown (9 of 10)]
[im 1/7]
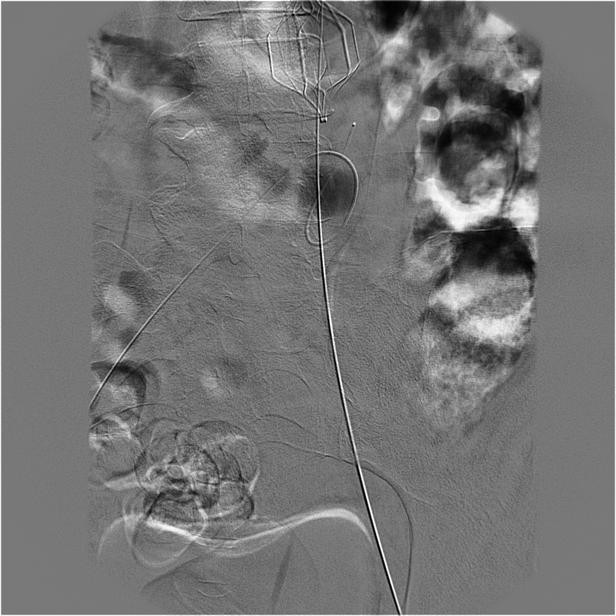

[Series 14: run · 1 of 11 slices shown (10 of 10)]
[im 1/11]
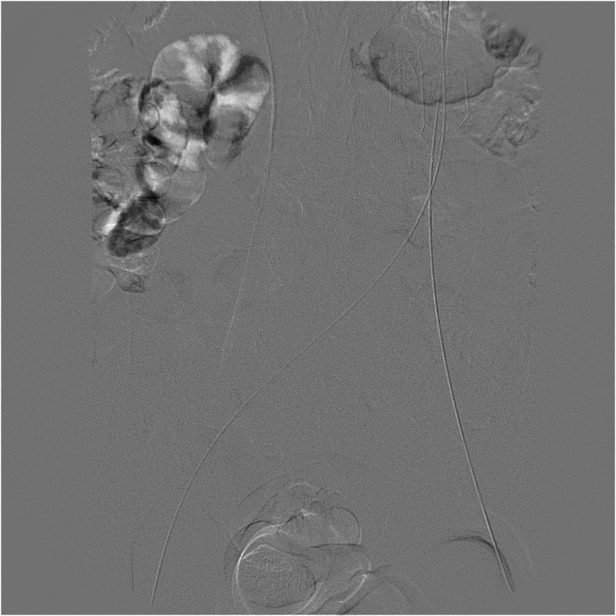

[13 of 24 positions shown; findings below may reference images not displayed]

Technique and findings:  Initially, bilateral lower extremity
venography was performed via the indwelling popliteal venous
sheaths.  The thrombolytic infusion was terminated.

On the right, there is significant resolution of the
femoropopliteal DVT with minimal scattered residual, good antegrade
flow.  The common femoral vein is widely patent.  External iliac
vein widely patent.  There was some residual thrombus in the common
iliac vein with good antegrade flow and no significant collateral
filling.  Flow through the IVC including the filter was identified.
Similarly on the right, there is near complete clearance of
occlusive thrombus from the femoropopliteal system with good
antegrade flow.  The external iliac vein is widely patent without
any residual thrombus.  There is mild residual mural thrombus in
the common iliac vein without high-grade stenosis.  There is good
straight outflow through the IVC and previously placed filter with
no significant pelvic or retroperitoneal collateral filling.

The sheaths and coaxial infusion catheters were then prepped and
draped in usual sterile fashion, infiltrated locally with 1%
lidocaine.  Intravenous Fentanyl and Versed were administered as
conscious sedation during continuous cardiorespiratory monitoring
by the radiology RN, with a total moderate sedation time of 25
minutes.
The left sheath and infusion catheter were exchanged over an
Amplatz wire for a long 7-French vascular sheath.  Additional iliac
venography was performed.  This demonstrated residual mural
thrombus in the   left common iliac vein without high-grade
stenosis or significant flow restriction.  There is no significant
collateral filling.  There is a small amount of caval mural
thrombus just inferior to the filter, with good flow through the
interstices of the filter into the suprarenal IVC.

The right infusion catheter and sheath were exchanged over an
Amplatz wire for a long 7-French vascular sheath.  Additional
venography showed mild tapered narrowing in the right common iliac
vein presumably secondary to some residual mural thrombus.  No
significant collateral filling.  Good flow through the IVC and
filter.

 14 mm angioplasty balloons advanced sequentially into the right
and left iliac veins for venous angioplasty.  There is no
significant resistant stenosis scattered. Follow-up venography
shows good flow through both common iliac vein segments with no
extravasation, dissection, or other apparent complication.
Continued good flow through the IVC and the filter which   remains
stable in position.

After follow-up venography, the sheaths and guide wires were
removed and hemostasis achieved at the sites. The patient tolerated
the procedure well.  No immediate complication.  The patient
remained on IV heparin.
IMPRESSION: 1.  Near complete resolution of bilateral femoropopliteal DVT.
2.  Residual nonocclusive mural thrombus in the bilateral common
iliac veins, with good antegrade flow through the IVC filter.
3.  Catheter directed thrombolysis was discontinued and the sheaths
removed without complication.
I telephoned the   test results to Dr. Jedediah at the time of
interpretation.

## 2012-01-08 ENCOUNTER — Ambulatory Visit: Payer: 59 | Admitting: Pharmacist

## 2012-01-08 ENCOUNTER — Other Ambulatory Visit (HOSPITAL_BASED_OUTPATIENT_CLINIC_OR_DEPARTMENT_OTHER): Payer: 59 | Admitting: Lab

## 2012-01-08 ENCOUNTER — Encounter: Payer: Self-pay | Admitting: Oncology

## 2012-01-08 ENCOUNTER — Telehealth: Payer: Self-pay | Admitting: Oncology

## 2012-01-08 ENCOUNTER — Ambulatory Visit (HOSPITAL_BASED_OUTPATIENT_CLINIC_OR_DEPARTMENT_OTHER): Payer: 59 | Admitting: Oncology

## 2012-01-08 VITALS — BP 110/74 | HR 68 | Temp 97.5°F | Resp 20 | Ht 74.0 in | Wt 288.2 lb

## 2012-01-08 DIAGNOSIS — I82409 Acute embolism and thrombosis of unspecified deep veins of unspecified lower extremity: Secondary | ICD-10-CM

## 2012-01-08 DIAGNOSIS — Z7901 Long term (current) use of anticoagulants: Secondary | ICD-10-CM

## 2012-01-08 LAB — CBC WITH DIFFERENTIAL/PLATELET
Basophils Absolute: 0 10*3/uL (ref 0.0–0.1)
Eosinophils Absolute: 0.1 10*3/uL (ref 0.0–0.5)
HGB: 13.6 g/dL (ref 11.6–15.9)
LYMPH%: 36.7 % (ref 14.0–49.7)
MCV: 92.4 fL (ref 79.5–101.0)
MONO#: 0.4 10*3/uL (ref 0.1–0.9)
NEUT#: 2.3 10*3/uL (ref 1.5–6.5)
Platelets: 214 10*3/uL (ref 145–400)
RBC: 4.4 10*6/uL (ref 3.70–5.45)
RDW: 13.9 % (ref 11.2–14.5)
WBC: 4.4 10*3/uL (ref 3.9–10.3)

## 2012-01-08 LAB — COMPREHENSIVE METABOLIC PANEL (CC13)
AST: 20 U/L (ref 5–34)
Albumin: 3.5 g/dL (ref 3.5–5.0)
Alkaline Phosphatase: 122 U/L (ref 40–150)
Calcium: 8.9 mg/dL (ref 8.4–10.4)
Chloride: 107 mEq/L (ref 98–107)
Potassium: 4.4 mEq/L (ref 3.5–5.1)
Sodium: 143 mEq/L (ref 136–145)
Total Protein: 6.4 g/dL (ref 6.4–8.3)

## 2012-01-08 LAB — PROTIME-INR
INR: 3.6 — ABNORMAL HIGH (ref 2.00–3.50)
Protime: 43.2 Seconds — ABNORMAL HIGH (ref 10.6–13.4)

## 2012-01-08 NOTE — Telephone Encounter (Signed)
appts made and printed for pt aom °

## 2012-01-08 NOTE — Progress Notes (Signed)
INR = 3.6 mg on 12.5 mg/day; 15 mg Mon Pt has a chest cold/cough for which she has been taking DayQuil & NyQuil.   No other significant changes to mention. INR slightly above goal today.  May be due to the NyQuil. No change to dose of Coumadin.  Pt in agreement w/ plan. Return in 1 month. Marily Lente, Pharm.D.

## 2012-01-08 NOTE — Progress Notes (Signed)
[facility]  Telephone:([phone] Fax:(336) [phone]   OFFICE PROGRESS NOTE   Cc:  Hollice Espy, MD   DIAGNOSIS: recurrent DVT.   CURRENT THERAPY: lifelong anticoagulation with Coumadin, goal INR 2.5-3.5. Currently, she is taking Coumadin 12.5mg  PO daily except for 15mg  PO daily on Monday.   INTERVAL HISTORY: Rachel Yoder 51 y.o. female returns for regular follow up.  She is taking Coumadin without problem.  She reports strict adherence without missing dose.  She has not seen any visible source of bleeding. She still travels by air quite a bit.  She wears compression stocking regularly.  She denies bilateral calf pain or swelling.   Patient denies fatigue, headache, visual changes, confusion, drenching night sweats, palpable lymph node swelling, mucositis, odynophagia, dysphagia, nausea vomiting, jaundice, chest pain, palpitation, shortness of breath, dyspnea on exertion, productive cough, gum bleeding, epistaxis, hematemesis, hemoptysis, abdominal pain, abdominal swelling, early satiety, melena, hematochezia, hematuria, skin rash, spontaneous bleeding, joint swelling, joint pain, heat or cold intolerance, bowel bladder incontinence, back pain, focal motor weakness, paresthesia, depression, suicidal or homocidal ideation, feeling hopelessness.   Past Medical History  Diagnosis Date  . DVT (deep venous thrombosis)     on Coumadin    History reviewed. No pertinent past surgical history.  Current Outpatient Prescriptions  Medication Sig Dispense Refill  . calcium carbonate (TUMS - DOSED IN MG ELEMENTAL CALCIUM) 500 MG chewable tablet Chew 1 tablet by mouth 2 (two) times daily.        . Ferrous Sulfate (IRON) 28 MG TABS Take 2 tablets by mouth daily.       . polycarbophil (FIBERCON) 625 MG tablet Take 1 tablet by mouth 2 (two) times daily.        . vitamin B-12 (CYANOCOBALAMIN) 500 MCG tablet Take 500 mcg by mouth every other day.        . Vitamin D,  Ergocalciferol, (DRISDOL) 50000 UNITS CAPS Take 50,000 Units by mouth Once a week.      . warfarin (COUMADIN) 5 MG tablet Take 1 tablet (5 mg total) by mouth as directed. Dose changes as directed by Coumadin Clinic  270 tablet  4  . zinc gluconate 50 MG tablet Take 50 mg by mouth daily.          ALLERGIES:   has no known allergies.  REVIEW OF SYSTEMS:  The rest of the 14-point review of system was negative.   Filed Vitals:   01/08/12 1055  BP: 110/74  Pulse: 68  Temp: 97.5 F (36.4 C)  Resp: 20   Wt Readings from Last 3 Encounters:  01/08/12 288 lb 3.2 oz (130.727 kg)  07/10/11 284 lb 9.6 oz (129.094 kg)  03/30/11 281 lb (127.461 kg)   ECOG Performance status: 0  PHYSICAL EXAMINATION:    General: well-nourished woman in no acute distress. Eyes: no scleral icterus. ENT: There were no oropharyngeal lesions. Neck was without thyromegaly. Lymphatics: Negative cervical, supraclavicular or axillary adenopathy. Respiratory: lungs were clear bilaterally without wheezing or crackles. Cardiovascular: Regular rate and rhythm, S1/S2, without murmur, rub or gallop. There was no pedal edema. GI: abdomen was soft, flat, nontender, nondistended, without organomegaly. Muscoloskeletal: no spinal tenderness of palpation of vertebral spine.  Skin exam was without echymosis, petichae. Neuro exam was nonfocal. Patient was able to get on and off exam table without assistance. Gait was normal. Patient was alerted and oriented. Attention was good. Language was appropriate. Mood was normal without depression. Speech was not pressured. Thought  content was not tangential.    LABORATORY/RADIOLOGY DATA:  Lab Results  Component Value Date   WBC 4.4 01/08/2012   HGB 13.6 01/08/2012   HCT 40.7 01/08/2012   PLT 214 01/08/2012   GLUCOSE 96 01/08/2012   ALKPHOS 122 01/08/2012   ALT 25 01/08/2012   AST 20 01/08/2012   NA 143 01/08/2012   K 4.4 01/08/2012   CL 107 01/08/2012   CREATININE 0.7 01/08/2012   BUN 8.0  01/08/2012   CO2 29 01/08/2012   INR 3.60* 01/08/2012     ASSESSMENT AND PLAN:   1. History of limb threatening deep venous thrombosis: I advised Ms. Wunschel to maintain Coumadin lifelong given the severity of her recurrent thrombosis in the past. Her INR is therapeutic today. She is followed by our Coumadin clinic and will continue with the current dose. She has another visit with the Coumadin clinic in about 1 month. In the future, if there is antidotes for rivaroxaban, I may consider switching. Visit with Dr Gaylyn Rong in 6 months. 2. Primary care: She said that she is up to date with mammogram. Colonoscopy was about 2 years ago and was negative.  However, her mother had colon cancer and thus she is due for colonoscopy every 5 years; next one is due in 2016.  Pap smear was negative in February 2012 (performed every 3 years). She already got the influenza vaccination in 2013. 3. Osteoporosis prophylaxis:  I advised her to take Calcium/Vit D since she is on lifelong Coumadin.     The length of time of the face-to-face encounter was 15 minutes. More than 50% of time was spent counseling and coordination of care.

## 2012-02-05 ENCOUNTER — Ambulatory Visit (HOSPITAL_BASED_OUTPATIENT_CLINIC_OR_DEPARTMENT_OTHER): Payer: 59 | Admitting: Pharmacist

## 2012-02-05 ENCOUNTER — Other Ambulatory Visit (HOSPITAL_BASED_OUTPATIENT_CLINIC_OR_DEPARTMENT_OTHER): Payer: 59 | Admitting: Lab

## 2012-02-05 DIAGNOSIS — I82409 Acute embolism and thrombosis of unspecified deep veins of unspecified lower extremity: Secondary | ICD-10-CM

## 2012-02-05 NOTE — Patient Instructions (Signed)
Continue same dose = 12.5mg  daily except 15mg  on Monday.   Recheck INR on 03/11/12 at 9:30 am for lab; 9:45 am for Coumadin clinic.

## 2012-02-05 NOTE — Progress Notes (Signed)
Pt asking about need for Amoxicillin prior to dental appmts since Ortho (hip) released her.  He said she may not need it anymore now that he has cleared her.  I suggested she ask her dentist as well.  I said from a coumadin standpoint it should not have a drastic effect on her INR.  She states she only goes twice a year for cleanings and has had no issues in the past.  She will continue same dose = 12.5mg  daily except 15mg  on Monday.   Recheck INR on 03/11/12 at 9:30 am for lab; 9:45 am for Coumadin clinic.

## 2012-03-11 ENCOUNTER — Other Ambulatory Visit (HOSPITAL_BASED_OUTPATIENT_CLINIC_OR_DEPARTMENT_OTHER): Payer: 59 | Admitting: Lab

## 2012-03-11 ENCOUNTER — Ambulatory Visit (HOSPITAL_BASED_OUTPATIENT_CLINIC_OR_DEPARTMENT_OTHER): Payer: 59 | Admitting: Pharmacist

## 2012-03-11 DIAGNOSIS — I82409 Acute embolism and thrombosis of unspecified deep veins of unspecified lower extremity: Secondary | ICD-10-CM

## 2012-03-11 LAB — PROTIME-INR
INR: 3.4 (ref 2.00–3.50)
Protime: 40.8 Seconds — ABNORMAL HIGH (ref 10.6–13.4)

## 2012-03-11 LAB — POCT INR: INR: 3.4

## 2012-03-11 NOTE — Progress Notes (Signed)
INR = 3.4 on 12.5 mg/day;15 mg Mon No new issues to report. Pt is stable on current dose for quite some time; we will see her every 6-8 weeks. No change to dose. Marily Lente, Pharm.D.

## 2012-03-19 ENCOUNTER — Other Ambulatory Visit: Payer: Self-pay

## 2012-04-21 ENCOUNTER — Other Ambulatory Visit: Payer: Self-pay | Admitting: Oncology

## 2012-04-21 DIAGNOSIS — I82409 Acute embolism and thrombosis of unspecified deep veins of unspecified lower extremity: Secondary | ICD-10-CM

## 2012-04-22 ENCOUNTER — Other Ambulatory Visit: Payer: 59 | Admitting: Lab

## 2012-04-22 ENCOUNTER — Ambulatory Visit (HOSPITAL_BASED_OUTPATIENT_CLINIC_OR_DEPARTMENT_OTHER): Payer: 59 | Admitting: Pharmacist

## 2012-04-22 DIAGNOSIS — I82409 Acute embolism and thrombosis of unspecified deep veins of unspecified lower extremity: Secondary | ICD-10-CM

## 2012-04-22 LAB — PROTIME-INR: Protime: 30 Seconds — ABNORMAL HIGH (ref 10.6–13.4)

## 2012-04-22 NOTE — Patient Instructions (Signed)
Continue same dose;12.5mg  daily except 15mg  on Monday.   Recheck INR on 05/27/12 at 9:00 am for lab; 9:15 am for Coumadin clinic.

## 2012-04-22 NOTE — Progress Notes (Signed)
INR within goal today. No problems to report regarding anticoagulation. Continue same dose: 12.5mg  daily except 15mg  on Monday.   Recheck INR on 05/27/12 at 9:00 am for lab; 9:15 am for Coumadin clinic.  **NO CHARGE COUMADIN CLINIC PATIENT**

## 2012-05-20 ENCOUNTER — Telehealth: Payer: Self-pay | Admitting: Oncology

## 2012-05-27 ENCOUNTER — Ambulatory Visit: Payer: 59 | Admitting: Pharmacist

## 2012-05-27 ENCOUNTER — Ambulatory Visit (HOSPITAL_BASED_OUTPATIENT_CLINIC_OR_DEPARTMENT_OTHER): Payer: 59 | Admitting: Lab

## 2012-05-27 ENCOUNTER — Other Ambulatory Visit: Payer: Self-pay | Admitting: Oncology

## 2012-05-27 DIAGNOSIS — I2699 Other pulmonary embolism without acute cor pulmonale: Secondary | ICD-10-CM

## 2012-05-27 DIAGNOSIS — I82409 Acute embolism and thrombosis of unspecified deep veins of unspecified lower extremity: Secondary | ICD-10-CM

## 2012-05-27 LAB — POCT INR: INR: 3.5

## 2012-05-27 NOTE — Progress Notes (Signed)
INR = 3.5 on 12.5 mg daily except 15 mg on Mondays No problems to report re: anticoag. No medicine changes. INR is therapeutic.  No change necessary to Coumadin dose. Return 07/04/12 w/ MD visit same day.  **NO CHARGE PT**  Ebony Hail, Pharm.D., CPP 05/27/2012@9 :40 AM

## 2012-07-04 ENCOUNTER — Other Ambulatory Visit (HOSPITAL_BASED_OUTPATIENT_CLINIC_OR_DEPARTMENT_OTHER): Payer: 59 | Admitting: Lab

## 2012-07-04 ENCOUNTER — Ambulatory Visit (HOSPITAL_BASED_OUTPATIENT_CLINIC_OR_DEPARTMENT_OTHER): Payer: 59 | Admitting: Oncology

## 2012-07-04 ENCOUNTER — Telehealth: Payer: Self-pay | Admitting: Oncology

## 2012-07-04 ENCOUNTER — Ambulatory Visit (HOSPITAL_BASED_OUTPATIENT_CLINIC_OR_DEPARTMENT_OTHER): Payer: 59 | Admitting: Pharmacist

## 2012-07-04 VITALS — BP 120/81 | HR 70 | Temp 96.9°F | Resp 18 | Ht 74.0 in | Wt 286.2 lb

## 2012-07-04 DIAGNOSIS — I82409 Acute embolism and thrombosis of unspecified deep veins of unspecified lower extremity: Secondary | ICD-10-CM

## 2012-07-04 DIAGNOSIS — Z7901 Long term (current) use of anticoagulants: Secondary | ICD-10-CM

## 2012-07-04 LAB — CBC WITH DIFFERENTIAL/PLATELET
BASO%: 0.9 % (ref 0.0–2.0)
Basophils Absolute: 0.1 10*3/uL (ref 0.0–0.1)
Eosinophils Absolute: 0.1 10*3/uL (ref 0.0–0.5)
HCT: 45.4 % (ref 34.8–46.6)
HGB: 14.6 g/dL (ref 11.6–15.9)
MCHC: 32.2 g/dL (ref 31.5–36.0)
MONO#: 0.4 10*3/uL (ref 0.1–0.9)
NEUT#: 3.5 10*3/uL (ref 1.5–6.5)
NEUT%: 62.9 % (ref 38.4–76.8)
WBC: 5.6 10*3/uL (ref 3.9–10.3)
lymph#: 1.5 10*3/uL (ref 0.9–3.3)

## 2012-07-04 LAB — PROTIME-INR

## 2012-07-04 LAB — POCT INR: INR: 3.4

## 2012-07-04 NOTE — Progress Notes (Signed)
[facility]  Telephone:(336) [phone] Fax:(336) [phone]   OFFICE PROGRESS NOTE   Cc:  Rachel Espy, MD   DIAGNOSIS: recurrent DVT.   CURRENT THERAPY: lifelong anticoagulation with Coumadin, goal INR 2.5-3.5. Currently, she is taking Coumadin 12.5mg  PO daily except for 15mg  PO daily on Monday and Friday.   INTERVAL HISTORY: Rachel Yoder 52 y.o. female returns for regular follow up by herself.  She reports feeling well.  She has been on stable dose of Coumadin with therapeutic INR.  She denied bleeding, hematoma, bruising.  She deniedfever, anorexia, weight loss, fatigue, headache, visual changes, confusion, drenching night sweats, palpable lymph node swelling, mucositis, odynophagia, dysphagia, nausea vomiting, jaundice, chest pain, palpitation, shortness of breath, dyspnea on exertion, productive cough, gum bleeding, epistaxis, hematemesis, hemoptysis, abdominal pain, abdominal swelling, early satiety, melena, hematochezia, hematuria, skin rash, spontaneous bleeding, joint swelling, joint pain, heat or cold intolerance, bowel bladder incontinence, back pain, focal motor weakness, paresthesia, depression.    Past Medical History  Diagnosis Date  . DVT (deep venous thrombosis)     on Coumadin    No past surgical history on file.  Current Outpatient Prescriptions  Medication Sig Dispense Refill  . calcium carbonate (TUMS - DOSED IN MG ELEMENTAL CALCIUM) 500 MG chewable tablet Chew 1 tablet by mouth 2 (two) times daily.        . Ferrous Sulfate (IRON) 28 MG TABS Take 1 tablet by mouth 2 (two) times daily.       . polycarbophil (FIBERCON) 625 MG tablet Take 1 tablet by mouth 2 (two) times daily.        . vitamin B-12 (CYANOCOBALAMIN) 500 MCG tablet Take 500 mcg by mouth every other day.        . Vitamin D, Ergocalciferol, (DRISDOL) 50000 UNITS CAPS Take 50,000 Units by mouth Once a week.      . warfarin (COUMADIN) 5 MG tablet Take 1 tablet (5 mg total) by mouth  as directed. Dose changes as directed by Coumadin Clinic  270 tablet  4  . zinc gluconate 50 MG tablet Take 50 mg by mouth daily.         No current facility-administered medications for this visit.    ALLERGIES:  has No Known Allergies.  REVIEW OF SYSTEMS:  The rest of the 14-point review of system was negative.   Filed Vitals:   07/04/12 0836  BP: 120/81  Pulse: 70  Temp: 96.9 F (36.1 C)  Resp: 18   Wt Readings from Last 3 Encounters:  07/04/12 286 lb 3.2 oz (129.819 kg)  01/08/12 288 lb 3.2 oz (130.727 kg)  07/10/11 284 lb 9.6 oz (129.094 kg)   ECOG Performance status: 0  PHYSICAL EXAMINATION:    General: well-nourished woman in no acute distress. Eyes: no scleral icterus. ENT: There were no oropharyngeal lesions. Neck was without thyromegaly. Lymphatics: Negative cervical, supraclavicular or axillary adenopathy. Respiratory: lungs were clear bilaterally without wheezing or crackles. Cardiovascular: Regular rate and rhythm, S1/S2, without murmur, rub or gallop. There was no pedal edema. GI: abdomen was soft, flat, nontender, nondistended, without organomegaly. Muscoloskeletal: no spinal tenderness of palpation of vertebral spine.  Skin exam was without echymosis, petichae. Neuro exam was nonfocal. Patient was able to get on and off exam table without assistance. Gait was normal. Patient was alerted and oriented. Attention was good. Language was appropriate. Mood was normal without depression. Speech was not pressured. Thought content was not tangential.    LABORATORY/RADIOLOGY DATA:  Lab  Results  Component Value Date   WBC 5.6 07/04/2012   HGB 14.6 07/04/2012   HCT 45.4 07/04/2012   PLT 254 07/04/2012   GLUCOSE 96 01/08/2012   ALKPHOS 122 01/08/2012   ALT 25 01/08/2012   AST 20 01/08/2012   NA 143 01/08/2012   K 4.4 01/08/2012   CL 107 01/08/2012   CREATININE 0.7 01/08/2012   BUN 8.0 01/08/2012   CO2 29 01/08/2012   INR 3.40 07/04/2012     ASSESSMENT AND PLAN:   1. History of  limb threatening deep venous thrombosis: Her first DVT was when she had bunion surgery and was in a cast.  He second DVT was after hip surgery.  Her DVT was very extensive requiring thrombectomy.  I advised Rachel Yoder to maintain Coumadin lifelong given the severity of her recurrent thrombosis in the past.  Her INR has been stable.  She is [facility] follow [facility] every 6 weeks.  I advised her to continue her current dose of Coumadin without dose modification.  She is up to date with cancer screening.  2. Follow up:  In about 4 months.     I informed Rachel Yoder that I am leaving the practice.  [facility] will arrange for her to see another provider when she returns.     The length of time of the face-to-face encounter was 10 minutes. More than 50% of time was spent counseling and coordination of care.

## 2012-07-04 NOTE — Progress Notes (Signed)
INR = 3.4 on Coumadin 12.5 mg daily; 15 mg on Mondays. No complaints today re: anticoag. INR at goal.  No change to dose. Pt seeing MD today; NO CHARGE. Repeat protime in 5 weeks. Ebony Hail, Pharm.D., CPP 07/04/2012@8 :41 AM

## 2012-07-04 NOTE — Telephone Encounter (Signed)
gv and printed appt sched and avs for pt for OCT. °

## 2012-07-08 ENCOUNTER — Other Ambulatory Visit: Payer: 59 | Admitting: Lab

## 2012-07-08 ENCOUNTER — Ambulatory Visit: Payer: 59 | Admitting: Oncology

## 2012-08-03 ENCOUNTER — Ambulatory Visit: Payer: 59

## 2012-08-12 ENCOUNTER — Other Ambulatory Visit (HOSPITAL_BASED_OUTPATIENT_CLINIC_OR_DEPARTMENT_OTHER): Payer: 59 | Admitting: Lab

## 2012-08-12 ENCOUNTER — Ambulatory Visit: Payer: 59 | Admitting: Pharmacist

## 2012-08-12 DIAGNOSIS — I82409 Acute embolism and thrombosis of unspecified deep veins of unspecified lower extremity: Secondary | ICD-10-CM

## 2012-08-12 DIAGNOSIS — I2699 Other pulmonary embolism without acute cor pulmonale: Secondary | ICD-10-CM

## 2012-08-12 LAB — PROTIME-INR: Protime: 49.2 Seconds — ABNORMAL HIGH (ref 10.6–13.4)

## 2012-08-12 NOTE — Progress Notes (Signed)
INR slightly above goal today. INR likely elevated secondary to recent social alcohol consumption. No problems to report. No changes in medications. She has had one change in her diet. Pt was on a business trip for the past week.  She would have wine with dinner and beer at a baseball game. She usually doesn't consume alcohol.  Pt will be leaving for another business trip on 7/27. Will recheck INR on 08/26/12 before pt leaves town. Take 10mg  today only (instead of 12.5mg ).  On 08/13/12, continue usual dose of 12.5mg  daily except 15mg  on Mondays.   Recheck INR in 2 weeks on 08/26/12 at 3:45pm for lab and 4pm for Coumadin clinic. This is an NO charge coumadin clinic patient.

## 2012-08-12 NOTE — Patient Instructions (Addendum)
Take 10mg  today only (instead of 12.5mg ).  On 08/13/12, continue usual dose of 12.5mg  daily except 15mg  on Mondays.   Recheck INR in 2 weeks on 08/26/12 at 3:45pm for lab and 4pm for Coumadin clinic.

## 2012-08-26 ENCOUNTER — Ambulatory Visit (HOSPITAL_BASED_OUTPATIENT_CLINIC_OR_DEPARTMENT_OTHER): Payer: 59 | Admitting: Pharmacist

## 2012-08-26 ENCOUNTER — Other Ambulatory Visit (HOSPITAL_BASED_OUTPATIENT_CLINIC_OR_DEPARTMENT_OTHER): Payer: 59 | Admitting: Lab

## 2012-08-26 DIAGNOSIS — Z7901 Long term (current) use of anticoagulants: Secondary | ICD-10-CM

## 2012-08-26 DIAGNOSIS — Z5181 Encounter for therapeutic drug level monitoring: Secondary | ICD-10-CM

## 2012-08-26 DIAGNOSIS — I82409 Acute embolism and thrombosis of unspecified deep veins of unspecified lower extremity: Secondary | ICD-10-CM

## 2012-08-26 DIAGNOSIS — Z86718 Personal history of other venous thrombosis and embolism: Secondary | ICD-10-CM

## 2012-08-26 DIAGNOSIS — I2699 Other pulmonary embolism without acute cor pulmonale: Secondary | ICD-10-CM

## 2012-08-26 LAB — POCT INR: INR: 3.4

## 2012-08-26 LAB — PROTIME-INR

## 2012-08-26 NOTE — Progress Notes (Addendum)
Patient's INR is therapeutic today at 3.4 (goal 2.5-3.5). She reports no complaints. Continue normal regimen of 12.5mg  daily except 15mg  on Monday.  Recheck INR in 5 weeks on 09/30/12 at 8:15 am for lab and 8:30 am for Coumadin clinic.   This is a no charge encounter; patient on no charge list.  Lillia Pauls, PharmD Clinical Pharmacist 08/26/2012 4:39 PM

## 2012-08-26 NOTE — Addendum Note (Signed)
Addended by: Mathis Fare on: 08/26/2012 05:44 PM   Modules accepted: Level of Service

## 2012-08-26 NOTE — Patient Instructions (Addendum)
Continue normal regimen of 12.5mg  daily except 15mg  on Monday.  Recheck INR in 5 weeks on 09/30/12 at 8:15 am for lab and 8:30 am for Coumadin clinic

## 2012-09-30 ENCOUNTER — Ambulatory Visit (HOSPITAL_BASED_OUTPATIENT_CLINIC_OR_DEPARTMENT_OTHER): Payer: 59 | Admitting: Pharmacist

## 2012-09-30 ENCOUNTER — Other Ambulatory Visit (HOSPITAL_BASED_OUTPATIENT_CLINIC_OR_DEPARTMENT_OTHER): Payer: 59 | Admitting: Lab

## 2012-09-30 DIAGNOSIS — I2699 Other pulmonary embolism without acute cor pulmonale: Secondary | ICD-10-CM

## 2012-09-30 DIAGNOSIS — I82409 Acute embolism and thrombosis of unspecified deep veins of unspecified lower extremity: Secondary | ICD-10-CM

## 2012-09-30 LAB — PROTIME-INR: INR: 2.7 (ref 2.00–3.50)

## 2012-09-30 NOTE — Progress Notes (Signed)
INR remains at goal and patient is stable on this dose. Patient reports no complaints. No bleeding/bruising and no missed/extra doses. She is doing well. This is a NO CHARGE visit as patient is on the no charge list. We will continue the same plan:  of 12.5mg  daily except 15mg  on Monday.   Recheck INR in 5 weeks on 11/04/12 at 8:00 am for lab and 8:30 am for MD visit and we will call you with the results (due to low staffing in the pharmacy this day no coumadin clinic visit is needed).

## 2012-09-30 NOTE — Patient Instructions (Addendum)
INR at goal! NO changes Continue normal regimen of 12.5mg  daily except 15mg  on Monday.   Recheck INR in 5 weeks on 11/04/12 at 8:00 am for lab and 8:30 am for MD visit and we will call you with the results.

## 2012-10-11 ENCOUNTER — Other Ambulatory Visit: Payer: Self-pay

## 2012-10-11 DIAGNOSIS — Z1231 Encounter for screening mammogram for malignant neoplasm of breast: Secondary | ICD-10-CM

## 2012-11-04 ENCOUNTER — Ambulatory Visit (HOSPITAL_BASED_OUTPATIENT_CLINIC_OR_DEPARTMENT_OTHER): Payer: 59 | Admitting: Lab

## 2012-11-04 ENCOUNTER — Other Ambulatory Visit (HOSPITAL_BASED_OUTPATIENT_CLINIC_OR_DEPARTMENT_OTHER): Payer: 59 | Admitting: Lab

## 2012-11-04 ENCOUNTER — Ambulatory Visit (HOSPITAL_BASED_OUTPATIENT_CLINIC_OR_DEPARTMENT_OTHER): Payer: 59 | Admitting: Pharmacist

## 2012-11-04 ENCOUNTER — Other Ambulatory Visit: Payer: Self-pay | Admitting: Hematology and Oncology

## 2012-11-04 ENCOUNTER — Encounter: Payer: Self-pay | Admitting: Hematology and Oncology

## 2012-11-04 ENCOUNTER — Telehealth: Payer: Self-pay | Admitting: Hematology and Oncology

## 2012-11-04 ENCOUNTER — Ambulatory Visit (HOSPITAL_BASED_OUTPATIENT_CLINIC_OR_DEPARTMENT_OTHER): Payer: 59 | Admitting: Hematology and Oncology

## 2012-11-04 VITALS — BP 130/85 | HR 77 | Temp 98.2°F | Resp 18 | Ht 74.0 in | Wt 295.3 lb

## 2012-11-04 DIAGNOSIS — D649 Anemia, unspecified: Secondary | ICD-10-CM

## 2012-11-04 DIAGNOSIS — I82403 Acute embolism and thrombosis of unspecified deep veins of lower extremity, bilateral: Secondary | ICD-10-CM

## 2012-11-04 DIAGNOSIS — I82409 Acute embolism and thrombosis of unspecified deep veins of unspecified lower extremity: Secondary | ICD-10-CM

## 2012-11-04 DIAGNOSIS — Z7901 Long term (current) use of anticoagulants: Secondary | ICD-10-CM

## 2012-11-04 DIAGNOSIS — Z9884 Bariatric surgery status: Secondary | ICD-10-CM

## 2012-11-04 HISTORY — DX: Anemia, unspecified: D64.9

## 2012-11-04 LAB — CBC WITH DIFFERENTIAL/PLATELET
BASO%: 1.2 % (ref 0.0–2.0)
LYMPH%: 26.1 % (ref 14.0–49.7)
MCHC: 33.5 g/dL (ref 31.5–36.0)
MCV: 91.4 fL (ref 79.5–101.0)
MONO%: 9.3 % (ref 0.0–14.0)
Platelets: 252 10*3/uL (ref 145–400)
RBC: 4.55 10*6/uL (ref 3.70–5.45)
WBC: 5.6 10*3/uL (ref 3.9–10.3)

## 2012-11-04 LAB — PROTIME-INR: Protime: 32.4 Seconds — ABNORMAL HIGH (ref 10.6–13.4)

## 2012-11-04 NOTE — Progress Notes (Signed)
[facility] OFFICE PROGRESS NOTE  Rachel Espy, MD DIAGNOSIS: Recurrent DVT, on lifelong anticoagulation therapy  SUMMARY OF HEMATOLOGIC HISTORY: She is a pleasant 52 year old lady with background history of recurrent DVT. Her first DVT was provoked after a bunion surgery on the right leg. Approximately 2 years ago, she had a left hip replacement surgery and after that, she developed severe bilateral DVT. She has remained on warfarin since then. INTERVAL HISTORY: Rachel Yoder 52 y.o. female returns for further followup. She denies any recent bleeding such as epistaxis, hematuria, or hematochezia. The patient is very active with all activities of daily living but she does travel a lot. She is up-to-date with all her screening programs including mammogram, Pap smear, and colonoscopy. She has received influenza vaccination recently. Her INR level is checked here in the clinic. She's on 12.5 mg daily except one day a week she's on 15 mg.  I have reviewed the past medical history, past surgical history, social history and family history with the patient and they are unchanged from previous note.  ALLERGIES:  has No Known Allergies.  MEDICATIONS: Current outpatient prescriptions:calcium carbonate (TUMS - DOSED IN MG ELEMENTAL CALCIUM) 500 MG chewable tablet, Chew 1 tablet by mouth 2 (two) times daily.  , Disp: , Rfl: ;  polycarbophil (FIBERCON) 625 MG tablet, Take 1 tablet by mouth 2 (two) times daily.  , Disp: , Rfl: ;  vitamin B-12 (CYANOCOBALAMIN) 500 MCG tablet, Take 500 mcg by mouth every other day.  , Disp: , Rfl:  Vitamin D, Ergocalciferol, (DRISDOL) 50000 UNITS CAPS, Take 50,000 Units by mouth Once a week., Disp: , Rfl: ;  warfarin (COUMADIN) 5 MG tablet, Take 1 tablet (5 mg total) by mouth as directed. Dose changes as directed by Coumadin Clinic, Disp: 270 tablet, Rfl: 4;  zinc gluconate 50 MG tablet, Take 50 mg by mouth daily.  , Disp: , Rfl: ;  Ferrous Sulfate (IRON) 28 MG  TABS, Take 1 tablet by mouth 2 (two) times daily. , Disp: , Rfl:   REVIEW OF SYSTEMS:   Constitutional: Denies fevers, chills or abnormal weight loss Eyes: Denies blurriness of vision Ears, nose, mouth, throat, and face: Denies mucositis or sore throat Respiratory: Denies cough, dyspnea or wheezes Cardiovascular: Denies palpitation, chest discomfort or lower extremity swelling Gastrointestinal:  Denies nausea, heartburn or change in bowel habits Skin: Denies abnormal skin rashes Lymphatics: Denies new lymphadenopathy or easy bruising Neurological:Denies numbness, tingling or new weaknesses Behavioral/Psych: Mood is stable, no new changes  All other systems were reviewed with the patient and are negative.  PHYSICAL EXAMINATION: ECOG PERFORMANCE STATUS: 0 - Asymptomatic  Filed Vitals:   11/04/12 0815  BP: 130/85  Pulse: 77  Temp: 98.2 F (36.8 C)  Resp: 18   Filed Weights   11/04/12 0815  Weight: 295 lb 4.8 oz (133.947 kg)     GENERAL:alert, no distress and comfortable SKIN: skin color, texture, turgor are normal, no rashes or significant lesions EYES: normal, Conjunctiva are pink and non-injected, sclera clear OROPHARYNX:no exudate, no erythema and lips, buccal mucosa, and tongue normal  NECK: supple, thyroid normal size, non-tender, without nodularity LYMPH:  no palpable lymphadenopathy in the cervical, axillary or inguinal LUNGS: clear to auscultation and percussion with normal breathing effort HEART: regular rate & rhythm and no murmurs and no lower extremity edema ABDOMEN:abdomen soft, non-tender and normal bowel sounds Musculoskeletal:no cyanosis of digits and no clubbing  NEURO: alert & oriented x 3 with fluent speech, no focal  motor/sensory deficits  LABORATORY DATA:  ASSESSMENT: Recurrent DVT, on lifelong anticoagulation therapy   PLAN:  #1 recurrent DVT We discussed other options off anticoagulation such as Xarelto which would not require her INR monitoring.  The patient is wondering whether the she will be a candidate to come off anticoagulation therapy since both episodes of DVT was provoked by surgery. I did not see a thrombophilia workup performed in the past. Her risk of recurrence of DVT would be quite high in the region of 20% or so within the first year of discontinuation of anticoagulation therapy. My preference would be for her to continue on warfarin indefinitely since she did not have any major side effects with this. If she elects to discontinue anticoagulation therapy, we would have to order a thrombophilia workup afterwards and if she does screen positive, then I will recommend she go back on long-term anticoagulation therapy. She would think about it. #2 history of gastric bypass The patient has been taking oral iron supplements twice a day for many years. Her last CBC from June showed that her CBC were within normal limits. I'm going to recheck her CBC today along with her ferritin level. The reason for doing this is so that she does not develop long-term iron overload in the future from her oral iron supplement. All questions were answered. The patient knows to call the clinic with any problems, questions or concerns. We can certainly see the patient much sooner if necessary. No barriers to learning was detected.  I spent 15 minutes counseling the patient face to face. The total time spent in the appointment was 20 minutes and more than 50% was on counseling.     Jlyn Cerros, MD 11/04/2012 8:39 AM

## 2012-11-04 NOTE — Progress Notes (Signed)
INR remains at goal and patient is stable on her Coumadin dose of 12.5mg  daily except for 15mg  on Mondays. Patient reports no complaints. No bleeding/bruising and no missed/extra doses. She is doing well.   This is a NO CHARGE visit as patient is on the no charge list. We will continue her current Coumadin dose.   Recheck INR in 5 weeks on 12/09/12 at 8:00 am for lab and 8:15 am for Coumadin Clinic.  Lillia Pauls, PharmD Clinical Pharmacist 11/04/2012 11:53 AM

## 2012-11-04 NOTE — Addendum Note (Signed)
Addended by: Laroy Apple E on: 11/04/2012 08:58 AM   Modules accepted: Orders

## 2012-11-04 NOTE — Patient Instructions (Signed)
Continue normal regimen of 12.5mg  daily except 15mg  on Monday.  Recheck INR in 5 weeks on 12/09/12 at 8:00 am for lab and 8:15 am for Coumadin Clinic.

## 2012-11-04 NOTE — Telephone Encounter (Signed)
gv adn printed appt sched and avs for pt for April 2015

## 2012-11-08 ENCOUNTER — Ambulatory Visit: Payer: 59

## 2012-11-08 ENCOUNTER — Other Ambulatory Visit: Payer: 59 | Admitting: Lab

## 2012-11-08 ENCOUNTER — Other Ambulatory Visit: Payer: Self-pay | Admitting: Oncology

## 2012-11-08 DIAGNOSIS — I82409 Acute embolism and thrombosis of unspecified deep veins of unspecified lower extremity: Secondary | ICD-10-CM

## 2012-11-14 ENCOUNTER — Telehealth: Payer: Self-pay | Admitting: Pharmacist

## 2012-11-14 ENCOUNTER — Ambulatory Visit
Admission: RE | Admit: 2012-11-14 | Discharge: 2012-11-14 | Disposition: A | Discharge status: Home / Self Care | Payer: 59 | Source: Ambulatory Visit

## 2012-11-14 DIAGNOSIS — Z1231 Encounter for screening mammogram for malignant neoplasm of breast: Secondary | ICD-10-CM

## 2012-11-14 NOTE — Telephone Encounter (Signed)
Called [facility] to update quantity of coumadin to #270 with one refill. I called to let patient know this was complete.

## 2012-12-08 ENCOUNTER — Other Ambulatory Visit: Payer: Self-pay

## 2012-12-08 ENCOUNTER — Other Ambulatory Visit: Payer: Self-pay | Admitting: Pharmacist

## 2012-12-08 DIAGNOSIS — I82409 Acute embolism and thrombosis of unspecified deep veins of unspecified lower extremity: Secondary | ICD-10-CM

## 2012-12-09 ENCOUNTER — Ambulatory Visit: Payer: 59 | Admitting: Pharmacist

## 2012-12-09 ENCOUNTER — Encounter: Payer: Self-pay | Admitting: Oncology

## 2012-12-09 ENCOUNTER — Other Ambulatory Visit (HOSPITAL_BASED_OUTPATIENT_CLINIC_OR_DEPARTMENT_OTHER): Payer: 59 | Admitting: Lab

## 2012-12-09 DIAGNOSIS — I82409 Acute embolism and thrombosis of unspecified deep veins of unspecified lower extremity: Secondary | ICD-10-CM

## 2012-12-09 LAB — POCT INR: INR: 3.5

## 2012-12-09 NOTE — Progress Notes (Signed)
INR = 3.5     Goal range = 2.5-3.5  INR at high end of goal range. Patient with no complications of anticoagulation.  She broke her right great toe 2 weeks ago and had some bruising at that time, but it is healing well. No changes to medications or diet.   She will continue 12.5 mg daily except 15 mg on Mondays and will return in 6 weeks on 01/20/13 for 8am lab and 8:15 Coumadin Clinic. Cletis Athens, PharmD  Patient is No Charge

## 2013-01-20 ENCOUNTER — Ambulatory Visit: Payer: 59 | Admitting: Pharmacist

## 2013-01-20 ENCOUNTER — Other Ambulatory Visit (HOSPITAL_BASED_OUTPATIENT_CLINIC_OR_DEPARTMENT_OTHER): Payer: 59

## 2013-01-20 DIAGNOSIS — I82409 Acute embolism and thrombosis of unspecified deep veins of unspecified lower extremity: Secondary | ICD-10-CM

## 2013-01-20 LAB — PROTIME-INR: Protime: 34.8 Seconds — ABNORMAL HIGH (ref 10.6–13.4)

## 2013-01-20 LAB — POCT INR: INR: 2.9

## 2013-01-20 NOTE — Patient Instructions (Signed)
INR at goal No changes Continue normal regimen of 12.5mg  daily except 15mg  on Monday.   Recheck INR in 6 weeks on 03/06/13 at 8:00 am for lab and 8:15 am for Coumadin Clinic. Have a [place] Christmas!

## 2013-01-20 NOTE — Progress Notes (Signed)
INR at goal Pt is doing well with no complaints No unusual bleeding or bruising No missed or extra doses and no changes to diet or medications Rachel Yoder is going home to [place] next week for [employer] She has been very stable on current coumadin dose  We will make no changes Plan: Continue normal regimen of 12.5mg  daily except 15mg  on Monday.   Recheck INR in 6 weeks on 03/06/13 at 8:00 am for lab and 8:15 am for Coumadin Clinic.  *Patient is No Charge*

## 2013-03-06 ENCOUNTER — Other Ambulatory Visit: Payer: 59

## 2013-03-06 ENCOUNTER — Ambulatory Visit: Payer: 59

## 2013-03-06 ENCOUNTER — Other Ambulatory Visit (HOSPITAL_BASED_OUTPATIENT_CLINIC_OR_DEPARTMENT_OTHER): Payer: 59

## 2013-03-06 ENCOUNTER — Ambulatory Visit (HOSPITAL_BASED_OUTPATIENT_CLINIC_OR_DEPARTMENT_OTHER): Payer: Self-pay | Admitting: Pharmacist

## 2013-03-06 DIAGNOSIS — I82409 Acute embolism and thrombosis of unspecified deep veins of unspecified lower extremity: Secondary | ICD-10-CM

## 2013-03-06 LAB — PROTIME-INR
INR: 3.2 (ref 2.00–3.50)
PROTIME: 38.4 s — AB (ref 10.6–13.4)

## 2013-03-06 LAB — POCT INR: INR: 3.2

## 2013-03-06 NOTE — Progress Notes (Signed)
INR at goal Pt doing well today with no complaints other than stress from work travel recently No missed or extra doses No unusual bleeding or bruising No diet changes Pt is going to start taking a supplement (Calcium-D Glucarate) and was wondering if this was ok to take with coumadin. There are no interactions listed on [employer] with this supplement and coumadin, so this should be fine Anticoagulation plan: No changes Continue normal regimen of 12.5mg  daily except 15mg  on Monday.   Recheck INR in 6 weeks on 04/17/13 at 8:00 am for lab and 8:15 am for Coumadin Clinic. *No charge coumadin clinic patient*

## 2013-03-06 NOTE — Patient Instructions (Signed)
INR at goal No changes Plan: Continue normal regimen of 12.5mg  daily except 15mg  on Monday.  Recheck INR in 6 weeks on 04/17/13 at 8:00 am for lab and 8:15 am for Coumadin Clinic

## 2013-04-10 ENCOUNTER — Other Ambulatory Visit (HOSPITAL_COMMUNITY)
Admission: RE | Admit: 2013-04-10 | Discharge: 2013-04-10 | Disposition: A | Discharge status: Home / Self Care | Payer: 59 | Source: Ambulatory Visit | Attending: Family Medicine | Admitting: Family Medicine

## 2013-04-10 ENCOUNTER — Other Ambulatory Visit: Payer: Self-pay | Admitting: Family Medicine

## 2013-04-10 DIAGNOSIS — Z1151 Encounter for screening for human papillomavirus (HPV): Secondary | ICD-10-CM | POA: Insufficient documentation

## 2013-04-10 DIAGNOSIS — Z124 Encounter for screening for malignant neoplasm of cervix: Secondary | ICD-10-CM | POA: Insufficient documentation

## 2013-04-17 ENCOUNTER — Other Ambulatory Visit (HOSPITAL_BASED_OUTPATIENT_CLINIC_OR_DEPARTMENT_OTHER): Payer: 59

## 2013-04-17 ENCOUNTER — Ambulatory Visit: Payer: 59 | Admitting: Pharmacist

## 2013-04-17 DIAGNOSIS — I82409 Acute embolism and thrombosis of unspecified deep veins of unspecified lower extremity: Secondary | ICD-10-CM

## 2013-04-17 LAB — PROTIME-INR
INR: 3.5 (ref 2.00–3.50)
Protime: 42 Seconds — ABNORMAL HIGH (ref 10.6–13.4)

## 2013-04-17 LAB — POCT INR: INR: 3.5

## 2013-04-17 NOTE — Progress Notes (Signed)
INR = 3.5 on Coumadin 12.5 mg/day except 15 mg Mon Pt had some wine over the weekend (on Saturday) while visiting friends & family in [place]. No med changes. Bruise on inner aspect of L arm. INR at goal.  Continue maintenance dose of Coumadin as-is. Return in 6 weeks PT IS NO CHARGE** Kennith Center, Pharm.D., CPP 04/17/2013@8 :33 AM

## 2013-05-05 ENCOUNTER — Encounter: Payer: Self-pay | Admitting: Hematology and Oncology

## 2013-05-05 ENCOUNTER — Ambulatory Visit (HOSPITAL_BASED_OUTPATIENT_CLINIC_OR_DEPARTMENT_OTHER): Payer: 59 | Admitting: Hematology and Oncology

## 2013-05-05 VITALS — BP 147/77 | HR 64 | Temp 98.0°F | Resp 20 | Ht 74.0 in | Wt 297.9 lb

## 2013-05-05 DIAGNOSIS — D649 Anemia, unspecified: Secondary | ICD-10-CM

## 2013-05-05 DIAGNOSIS — Z7901 Long term (current) use of anticoagulants: Secondary | ICD-10-CM

## 2013-05-05 DIAGNOSIS — I82409 Acute embolism and thrombosis of unspecified deep veins of unspecified lower extremity: Secondary | ICD-10-CM

## 2013-05-05 DIAGNOSIS — Z9884 Bariatric surgery status: Secondary | ICD-10-CM

## 2013-05-05 NOTE — Progress Notes (Signed)
[facility] OFFICE PROGRESS NOTE  [name], MD DIAGNOSIS:  Recurrent DVT, on lifelong anticoagulation therapy  SUMMARY OF HEMATOLOGIC HISTORY: She is a pleasant 53 year old lady with background history of recurrent DVT. Her first DVT was provoked after a bunion surgery on the right leg. Approximately 2 years ago, she had a left hip replacement surgery and after that, she developed severe bilateral DVT. She has remained on warfarin since then. INTERVAL HISTORY: DEMONI PARMAR 53 y.o. female returns for further followup. She denies any bleeding complication. She's able to tolerate oral iron supplement twice a day and continue to take high-dose vitamin D supplement for history of gastric bypass.  I have reviewed the past medical history, past surgical history, social history and family history with the patient and they are unchanged from previous note.  ALLERGIES:  has No Known Allergies.  MEDICATIONS:  Current Outpatient Prescriptions  Medication Sig Dispense Refill  . Calcium Carb-Cholecalciferol (CALCIUM + D3 PO) Take 1 tablet by mouth daily. Calcium D-gluconate      . calcium carbonate (TUMS - DOSED IN MG ELEMENTAL CALCIUM) 500 MG chewable tablet Chew 1 tablet by mouth 2 (two) times daily.        . Ferrous Sulfate (IRON) 28 MG TABS Take 1 tablet by mouth 2 (two) times daily.       . polycarbophil (FIBERCON) 625 MG tablet Take 1 tablet by mouth 2 (two) times daily.        . vitamin B-12 (CYANOCOBALAMIN) 500 MCG tablet Take 500 mcg by mouth every other day.        . Vitamin D, Ergocalciferol, (DRISDOL) 50000 UNITS CAPS Take 50,000 Units by mouth Once a week.      . warfarin (COUMADIN) 5 MG tablet Take as directed  60 tablet  1  . zinc gluconate 50 MG tablet Take 50 mg by mouth daily.         No current facility-administered medications for this visit.     REVIEW OF SYSTEMS:   Constitutional: Denies fevers, chills or night sweats Eyes: Denies blurriness of  vision Ears, nose, mouth, throat, and face: Denies mucositis or sore throat Respiratory: Denies cough, dyspnea or wheezes Cardiovascular: Denies palpitation, chest discomfort or lower extremity swelling Gastrointestinal:  Denies nausea, heartburn or change in bowel habits Skin: Denies abnormal skin rashes Lymphatics: Denies new lymphadenopathy or easy bruising Neurological:Denies numbness, tingling or new weaknesses Behavioral/Psych: Mood is stable, no new changes  All other systems were reviewed with the patient and are negative.  PHYSICAL EXAMINATION: ECOG PERFORMANCE STATUS: 0 - Asymptomatic  Filed Vitals:   05/05/13 1422  BP: 147/77  Pulse: 64  Temp: 98 F (36.7 C)  Resp: 20   Filed Weights   05/05/13 1422  Weight: 297 lb 14.4 oz (135.127 kg)    GENERAL:alert, no distress and comfortable. She is morbidly obese SKIN: skin color, texture, turgor are normal, no rashes or significant lesions EYES: normal, Conjunctiva are pink and non-injected, sclera clear OROPHARYNX:no exudate, no erythema and lips, buccal mucosa, and tongue normal  NECK: supple, thyroid normal size, non-tender, without nodularity LYMPH:  no palpable lymphadenopathy in the cervical, axillary or inguinal LUNGS: clear to auscultation and percussion with normal breathing effort HEART: regular rate & rhythm and no murmurs and no lower extremity edema ABDOMEN:abdomen soft, non-tender and normal bowel sounds Musculoskeletal:no cyanosis of digits and no clubbing  NEURO: alert & oriented x 3 with fluent speech, no focal motor/sensory deficits  LABORATORY DATA:  I have reviewed the data as listed No results found for this or any previous visit (from the past 48 hour(s)).  Lab Results  Component Value Date   WBC 5.6 11/04/2012   HGB 13.9 11/04/2012   HCT 41.6 11/04/2012   MCV 91.4 11/04/2012   PLT 252 11/04/2012   ASSESSMENT & PLAN:  #1 recurrent DVT We discussed other options off anticoagulation such as Xarelto  which would not require her INR monitoring. The patient is wondering whether the she will be a candidate to come off anticoagulation therapy since both episodes of DVT was provoked by surgery. I did not see a thrombophilia workup performed in the past. Her risk of recurrence of DVT would be quite high in the region of 20% or so within the first year of discontinuation of anticoagulation therapy. My preference would be for her to continue on warfarin indefinitely since she did not have any major side effects with this. If she elects to discontinue anticoagulation therapy, we would have to order a thrombophilia workup afterwards and if she does screen positive, then I will recommend she go back on long-term anticoagulation therapy.  After a long discussion, the patient is comfortable to remain on anticoagulation therapy for life as she has no complication from her treatment. #2 history of gastric bypass The patient has been taking oral iron supplements twice a day for many years. Her last CBC and her ferritin level are within normal limits. I will continue to order a CBC and iron study once a year. The reason for doing this is so that she does not develop long-term iron overload in the future from her oral iron supplement. All questions were answered. The patient knows to call the clinic with any problems, questions or concerns. We can certainly see the patient much sooner if necessary. No barriers to learning was detected. I spent 15 minutes counseling the patient face to face. The total time spent in the appointment was 20 minutes and more than 50% was on counseling.     [facility], [place], MD 05/05/2013 3:06 PM

## 2013-05-08 ENCOUNTER — Telehealth: Payer: Self-pay | Admitting: Hematology and Oncology

## 2013-05-08 NOTE — Telephone Encounter (Signed)
s.w. pt and advised on April 2016 appt.....pt ok and aware °

## 2013-05-29 ENCOUNTER — Telehealth: Payer: Self-pay | Admitting: Hematology and Oncology

## 2013-05-29 ENCOUNTER — Other Ambulatory Visit (HOSPITAL_BASED_OUTPATIENT_CLINIC_OR_DEPARTMENT_OTHER): Payer: 59

## 2013-05-29 ENCOUNTER — Ambulatory Visit (HOSPITAL_BASED_OUTPATIENT_CLINIC_OR_DEPARTMENT_OTHER): Payer: Self-pay | Admitting: Pharmacist

## 2013-05-29 DIAGNOSIS — I82409 Acute embolism and thrombosis of unspecified deep veins of unspecified lower extremity: Secondary | ICD-10-CM

## 2013-05-29 LAB — POCT INR: INR: 2.3

## 2013-05-29 LAB — PROTIME-INR
INR: 2.3 (ref 2.00–3.50)
Protime: 27.6 Seconds — ABNORMAL HIGH (ref 10.6–13.4)

## 2013-05-29 MED ORDER — WARFARIN SODIUM 5 MG PO TABS: 5.0000 mg | ORAL_TABLET | ORAL | Status: DC

## 2013-05-29 NOTE — Patient Instructions (Signed)
Continue normal regimen of 12.5mg  daily except 15mg  on Monday.   Recheck INR in 6 weeks on 07/10/13 at 8:00 am for lab and 8:30 am for Coumadin Clinic.

## 2013-05-29 NOTE — Telephone Encounter (Signed)
added cc appt per provider

## 2013-05-29 NOTE — Progress Notes (Signed)
Pt seen is a NO CHARGE patient  INR=2.3 on 12.5 mg daily with 15mg  on Mon She has been fairly therapeutic on this dose for almost 2 years Will not change dose Pt has been traveling quite a bit lately but will be home until first week of June now Requested and sent a new RX to [facility] mail order, Coumadin 5mg  UAD  #270 with 3 refills Continue same dose and RTC on 07/10/13 at 8:00 am for lab and 8:15 for CC

## 2013-07-10 ENCOUNTER — Ambulatory Visit: Payer: 59 | Admitting: Pharmacist

## 2013-07-10 ENCOUNTER — Other Ambulatory Visit (HOSPITAL_BASED_OUTPATIENT_CLINIC_OR_DEPARTMENT_OTHER): Payer: 59

## 2013-07-10 DIAGNOSIS — I82409 Acute embolism and thrombosis of unspecified deep veins of unspecified lower extremity: Secondary | ICD-10-CM

## 2013-07-10 LAB — PROTIME-INR
INR: 3.9 — ABNORMAL HIGH (ref 2.00–3.50)
Protime: 46.8 Seconds — ABNORMAL HIGH (ref 10.6–13.4)

## 2013-07-10 LAB — POCT INR: INR: 3.9

## 2013-07-10 NOTE — Progress Notes (Signed)
*  No Charge Patient* INR just above goal Pt is doing well today She just returned from a business trip to [facility] She reports no unusual bleeding or bruising and no signs/symptoms of clotting No missed or extra doses Her diet on her trip was not her normal diet as she was eating out for most meals and also had ~ 1 glass of wine a night This is likely the result of her elevated INR this week No other diet or medication changes Plan Take 10 mg today only(instead of 15 mg)  then continue normal regimen of 12.5mg  daily except 15mg  on Monday.   Recheck INR in 6 weeks on 08/21/13 at 8:00 am for lab and 8:15 am for Coumadin Clinic.

## 2013-07-10 NOTE — Patient Instructions (Signed)
INR just above goal Take 10 mg today only then continue normal regimen of 12.5mg  daily except 15mg  on Monday.   Recheck INR in 6 weeks on 08/21/13 at 8:00 am for lab and 8:15 am for Coumadin Clinic.

## 2013-08-21 ENCOUNTER — Ambulatory Visit: Payer: 59 | Admitting: Pharmacist

## 2013-08-21 ENCOUNTER — Other Ambulatory Visit (HOSPITAL_BASED_OUTPATIENT_CLINIC_OR_DEPARTMENT_OTHER): Payer: 59

## 2013-08-21 DIAGNOSIS — D649 Anemia, unspecified: Secondary | ICD-10-CM

## 2013-08-21 DIAGNOSIS — Z862 Personal history of diseases of the blood and blood-forming organs and certain disorders involving the immune mechanism: Secondary | ICD-10-CM

## 2013-08-21 DIAGNOSIS — I82409 Acute embolism and thrombosis of unspecified deep veins of unspecified lower extremity: Secondary | ICD-10-CM

## 2013-08-21 LAB — CBC WITH DIFFERENTIAL/PLATELET
BASO%: 0.7 % (ref 0.0–2.0)
Basophils Absolute: 0 10*3/uL (ref 0.0–0.1)
EOS ABS: 0.1 10*3/uL (ref 0.0–0.5)
EOS%: 1.9 % (ref 0.0–7.0)
HEMATOCRIT: 42.5 % (ref 34.8–46.6)
HGB: 13.7 g/dL (ref 11.6–15.9)
LYMPH%: 28.8 % (ref 14.0–49.7)
MCH: 30.2 pg (ref 25.1–34.0)
MCHC: 32.2 g/dL (ref 31.5–36.0)
MCV: 93.6 fL (ref 79.5–101.0)
MONO#: 0.5 10*3/uL (ref 0.1–0.9)
MONO%: 8.4 % (ref 0.0–14.0)
NEUT%: 60.2 % (ref 38.4–76.8)
NEUTROS ABS: 3.2 10*3/uL (ref 1.5–6.5)
PLATELETS: 234 10*3/uL (ref 145–400)
RBC: 4.54 10*6/uL (ref 3.70–5.45)
RDW: 13.5 % (ref 11.2–14.5)
WBC: 5.3 10*3/uL (ref 3.9–10.3)
lymph#: 1.5 10*3/uL (ref 0.9–3.3)

## 2013-08-21 LAB — PROTIME-INR
INR: 4 — ABNORMAL HIGH (ref 2.00–3.50)
Protime: 48 Seconds — ABNORMAL HIGH (ref 10.6–13.4)

## 2013-08-21 LAB — FERRITIN CHCC: FERRITIN: 46 ng/mL (ref 9–269)

## 2013-08-21 LAB — POCT INR: INR: 4

## 2013-08-21 NOTE — Progress Notes (Addendum)
INR slightly above goal today. INR goal = 2.5 -3.5  CBC is wnl today. Ferritin also drawn today. INR unchanged from last visit (07/10/13 INR = 3.9). Pt took coumadin as instructed at last visit. No changes in diet or medications. No missed or extra coumadin doses. No s/s of clotting noted. Pt did have a cut on the tip of one of her fingers which is healing well. It didn't bleed that much. INR elevated/unchanged x 2 visits. Will slightly decrease coumadin dose. Decrease coumadin to 12.5mg  daily. No other bleeding. No unusual bruising. Recheck INR in 2 weeks on 09/04/13:  lab at 8am and coumadin clinic at 8:15am. F/U ferritin level. NO charge coumadin clinic patient.

## 2013-08-21 NOTE — Patient Instructions (Signed)
Decrease coumadin to 12.5mg  daily.  Recheck INR in 2 weeks on 09/04/13:  lab at 8am and coumadin clinic at 8:15am.

## 2013-09-04 ENCOUNTER — Ambulatory Visit: Payer: 59 | Admitting: Pharmacist

## 2013-09-04 ENCOUNTER — Other Ambulatory Visit (HOSPITAL_BASED_OUTPATIENT_CLINIC_OR_DEPARTMENT_OTHER): Payer: 59

## 2013-09-04 DIAGNOSIS — I82409 Acute embolism and thrombosis of unspecified deep veins of unspecified lower extremity: Secondary | ICD-10-CM

## 2013-09-04 LAB — PROTIME-INR
INR: 3.2 (ref 2.00–3.50)
PROTIME: 38.4 s — AB (ref 10.6–13.4)

## 2013-09-04 LAB — POCT INR: INR: 3.2

## 2013-09-04 NOTE — Patient Instructions (Addendum)
INR at goal after slight dose adjustments No changes Continue coumadin 12.5mg  daily. Recheck INR in 6 weeks on 10/16/13: Lab at 8am and coumadin clinic at 8:15am.

## 2013-09-04 NOTE — Patient Instructions (Signed)
INR at goal after slight dose adjustments No changes Continue coumadin 12.5mg  daily. Recheck INR in 6 weeks on 10/16/13: Lab at 8am and coumadin clinic at 8:15am.

## 2013-09-04 NOTE — Progress Notes (Signed)
*  No charge - coumadin clinic patient* INR at goal after slight dose adjustment last visit Pt is doing well with no complaints No unusual bleeding or bruising to report  No missed or extra doses No medication or diet changes Pt recently returned from business trip to [facility] and will be leaving later this week for [place] Pt is also in the process of building a new house in [place] INR appears to have stabilized after being elevated x 2 visits Plan: No changes Continue coumadin 12.5mg  daily. Recheck INR in 6 weeks on 10/16/13: Lab at 8am and coumadin clinic at 8:15am.

## 2013-10-10 ENCOUNTER — Other Ambulatory Visit: Payer: Self-pay

## 2013-10-10 DIAGNOSIS — Z1231 Encounter for screening mammogram for malignant neoplasm of breast: Secondary | ICD-10-CM

## 2013-10-16 ENCOUNTER — Ambulatory Visit (HOSPITAL_BASED_OUTPATIENT_CLINIC_OR_DEPARTMENT_OTHER): Payer: Self-pay | Admitting: Pharmacist

## 2013-10-16 ENCOUNTER — Other Ambulatory Visit (HOSPITAL_BASED_OUTPATIENT_CLINIC_OR_DEPARTMENT_OTHER): Payer: 59

## 2013-10-16 DIAGNOSIS — I82409 Acute embolism and thrombosis of unspecified deep veins of unspecified lower extremity: Secondary | ICD-10-CM

## 2013-10-16 LAB — PROTIME-INR
INR: 2.3 (ref 2.00–3.50)
Protime: 27.6 Seconds — ABNORMAL HIGH (ref 10.6–13.4)

## 2013-10-16 LAB — POCT INR: INR: 2.3

## 2013-10-16 NOTE — Progress Notes (Signed)
**  No charge pt**  INR slightly below goal of 2.5-3.5. No changes in meds.  No bleeding/bruising.  No medical changes.  Will have Ms Bamba take 15mg  today, then resume 12.5mg  daily.  Will check PT/INR in 3 weeks to ensure INR back at goal.

## 2013-11-06 ENCOUNTER — Ambulatory Visit: Payer: 59 | Admitting: Pharmacist

## 2013-11-06 ENCOUNTER — Other Ambulatory Visit (HOSPITAL_BASED_OUTPATIENT_CLINIC_OR_DEPARTMENT_OTHER): Payer: 59

## 2013-11-06 DIAGNOSIS — I82409 Acute embolism and thrombosis of unspecified deep veins of unspecified lower extremity: Secondary | ICD-10-CM

## 2013-11-06 LAB — POCT INR: INR: 3.4

## 2013-11-06 LAB — PROTIME-INR
INR: 3.4 (ref 2.00–3.50)
Protime: 40.8 Seconds — ABNORMAL HIGH (ref 10.6–13.4)

## 2013-11-06 NOTE — Progress Notes (Signed)
INR = 3.4 after taking Coumadin 15 mg x 1 dose on 10/16/13 then resuming 12.5 mg daily No complaints re: anticoag today. No med changes recently. INR back within goal range.  Continue Coumadin 12.5 mg daily. Return in 5 weeks. NO CHARGE PT Kennith Center, Pharm.D., CPP 11/06/2013@9 :12 AM

## 2013-11-20 ENCOUNTER — Ambulatory Visit
Admission: RE | Admit: 2013-11-20 | Discharge: 2013-11-20 | Disposition: A | Discharge status: Home / Self Care | Payer: 59 | Source: Ambulatory Visit

## 2013-11-20 DIAGNOSIS — Z1231 Encounter for screening mammogram for malignant neoplasm of breast: Secondary | ICD-10-CM

## 2013-12-04 ENCOUNTER — Encounter: Payer: Self-pay | Admitting: Hematology and Oncology

## 2013-12-11 ENCOUNTER — Ambulatory Visit (HOSPITAL_BASED_OUTPATIENT_CLINIC_OR_DEPARTMENT_OTHER): Payer: Self-pay | Admitting: Pharmacist

## 2013-12-11 ENCOUNTER — Other Ambulatory Visit (HOSPITAL_BASED_OUTPATIENT_CLINIC_OR_DEPARTMENT_OTHER): Payer: 59

## 2013-12-11 DIAGNOSIS — I82409 Acute embolism and thrombosis of unspecified deep veins of unspecified lower extremity: Secondary | ICD-10-CM

## 2013-12-11 LAB — PROTIME-INR
INR: 3.1 (ref 2.00–3.50)
PROTIME: 37.2 s — AB (ref 10.6–13.4)

## 2013-12-11 LAB — POCT INR: INR: 3.1

## 2013-12-11 NOTE — Progress Notes (Signed)
INR = 3.1 on Coumadin 12.5 mg daily. Pt doing well on anticoag w/o complaints. She will be moving to [facility] area the first of the new year & will need to find a new hematologist in her network that will provide Coumadin clinic services. INR ok today.  No change to dose of Coumadin. Return in 6 weeks. NO CHARGE PT Kennith Center, Pharm.D., CPP 12/11/2013@9 :01 AM

## 2014-01-11 ENCOUNTER — Telehealth: Payer: Self-pay | Admitting: Pharmacist

## 2014-01-11 NOTE — Telephone Encounter (Signed)
Patient calls stating she was started on doxycycline on Monday 12/4. She has experienced no bleeding or bruising and feels "perfectly normal." She would like to know if she needs to be seen sooner than her appt on 12/21. Told her that doxycyline is a moderate drug interaction with warfarin, most patient see little effect on their INR but it is possible to see a major change. Since she is asymptomatic and will only be on the drug for 3 more days, we do not need to see her unless it would make her more comfortable. She states that by the time she would come in on Monday, she will be off doxycycline. She is aware that she should call if she has any unusual bleeding or bruising. She will call CC if it is during business hours or the answering service for [facility] if it is after hours.

## 2014-01-22 ENCOUNTER — Ambulatory Visit (HOSPITAL_BASED_OUTPATIENT_CLINIC_OR_DEPARTMENT_OTHER): Payer: 59 | Admitting: Pharmacist

## 2014-01-22 ENCOUNTER — Ambulatory Visit (HOSPITAL_BASED_OUTPATIENT_CLINIC_OR_DEPARTMENT_OTHER): Payer: 59 | Admitting: Lab

## 2014-01-22 DIAGNOSIS — I82409 Acute embolism and thrombosis of unspecified deep veins of unspecified lower extremity: Secondary | ICD-10-CM

## 2014-01-22 LAB — PROTIME-INR
INR: 2.6 (ref 2.00–3.50)
Protime: 31.2 Seconds — ABNORMAL HIGH (ref 10.6–13.4)

## 2014-01-22 LAB — POCT INR: INR: 2.6

## 2014-01-22 NOTE — Progress Notes (Signed)
**  No charge coumadin pt**  INR at goal of 2.5-3.5.  Rachel Yoder is finishing her last dose of doxycycline day #14 today for an infection on her foot.  No changes in meds.  No bleeding or bruising.  Rachel Yoder has been on current coumadin dose of 12.5mg  daily since July 2015 and will continue.  Will check Pt/INR in 2 months.

## 2014-03-23 ENCOUNTER — Other Ambulatory Visit (HOSPITAL_BASED_OUTPATIENT_CLINIC_OR_DEPARTMENT_OTHER): Payer: Managed Care, Other (non HMO)

## 2014-03-23 ENCOUNTER — Ambulatory Visit (HOSPITAL_BASED_OUTPATIENT_CLINIC_OR_DEPARTMENT_OTHER): Payer: Self-pay | Admitting: Pharmacist

## 2014-03-23 DIAGNOSIS — I82409 Acute embolism and thrombosis of unspecified deep veins of unspecified lower extremity: Secondary | ICD-10-CM

## 2014-03-23 LAB — PROTIME-INR
INR: 3 (ref 2.00–3.50)
Protime: 36 Seconds — ABNORMAL HIGH (ref 10.6–13.4)

## 2014-03-23 LAB — POCT INR: INR: 3

## 2014-03-23 NOTE — Progress Notes (Signed)
INR = 3 on Coumadin 12.5 mg daily. Pt doing well & has no complaints re: anticoag. No med changes recently. INR at goal.  No change necessary to dose of Coumadin. Return 05/07/14 when she sees Dr. Alvy Bimler same day. Kennith Center, Pharm.D., CPP NO CHARGE PT 03/23/2014@8 :21 AM

## 2014-05-07 ENCOUNTER — Other Ambulatory Visit (HOSPITAL_BASED_OUTPATIENT_CLINIC_OR_DEPARTMENT_OTHER): Payer: Managed Care, Other (non HMO)

## 2014-05-07 ENCOUNTER — Encounter: Payer: Self-pay | Admitting: Hematology and Oncology

## 2014-05-07 ENCOUNTER — Ambulatory Visit (HOSPITAL_BASED_OUTPATIENT_CLINIC_OR_DEPARTMENT_OTHER): Payer: Managed Care, Other (non HMO) | Admitting: Hematology and Oncology

## 2014-05-07 ENCOUNTER — Telehealth: Payer: Self-pay | Admitting: Hematology and Oncology

## 2014-05-07 ENCOUNTER — Ambulatory Visit: Payer: Managed Care, Other (non HMO) | Admitting: Pharmacist

## 2014-05-07 VITALS — BP 132/71 | HR 69 | Temp 98.0°F | Resp 19 | Ht 74.0 in | Wt 305.8 lb

## 2014-05-07 DIAGNOSIS — I824Z9 Acute embolism and thrombosis of unspecified deep veins of unspecified distal lower extremity: Secondary | ICD-10-CM

## 2014-05-07 DIAGNOSIS — I82409 Acute embolism and thrombosis of unspecified deep veins of unspecified lower extremity: Secondary | ICD-10-CM | POA: Diagnosis not present

## 2014-05-07 DIAGNOSIS — I82403 Acute embolism and thrombosis of unspecified deep veins of lower extremity, bilateral: Secondary | ICD-10-CM

## 2014-05-07 LAB — CBC WITH DIFFERENTIAL/PLATELET
BASO%: 1.2 % (ref 0.0–2.0)
BASOS ABS: 0.1 10*3/uL (ref 0.0–0.1)
EOS%: 2.5 % (ref 0.0–7.0)
Eosinophils Absolute: 0.1 10*3/uL (ref 0.0–0.5)
HCT: 43.3 % (ref 34.8–46.6)
HEMOGLOBIN: 14.1 g/dL (ref 11.6–15.9)
LYMPH%: 28.6 % (ref 14.0–49.7)
MCH: 29.6 pg (ref 25.1–34.0)
MCHC: 32.5 g/dL (ref 31.5–36.0)
MCV: 91.3 fL (ref 79.5–101.0)
MONO#: 0.4 10*3/uL (ref 0.1–0.9)
MONO%: 8.2 % (ref 0.0–14.0)
NEUT%: 59.5 % (ref 38.4–76.8)
NEUTROS ABS: 3.2 10*3/uL (ref 1.5–6.5)
Platelets: 258 10*3/uL (ref 145–400)
RBC: 4.75 10*6/uL (ref 3.70–5.45)
RDW: 13.5 % (ref 11.2–14.5)
WBC: 5.4 10*3/uL (ref 3.9–10.3)
lymph#: 1.6 10*3/uL (ref 0.9–3.3)

## 2014-05-07 LAB — PROTIME-INR
INR: 3.4 (ref 2.00–3.50)
Protime: 40.8 Seconds — ABNORMAL HIGH (ref 10.6–13.4)

## 2014-05-07 LAB — POCT INR: INR: 3.4

## 2014-05-07 NOTE — Progress Notes (Signed)
[facility] NOTE  Rachel Smolder, MD SUMMARY OF HEMATOLOGIC HISTORY: She is a pleasant 54 year old lady with background history of recurrent DVT. Her first DVT was provoked after a bunion surgery on the right leg. Approximately 2 years ago, she had a left hip replacement surgery and after that, she developed severe bilateral DVT. She has remained on warfarin since then. INTERVAL HISTORY: Rachel Yoder 54 y.o. female returns for further follow-up. She remained on high-dose vitamin supplement for history of gastric bypass. The patient denies any recent signs or symptoms of bleeding such as spontaneous epistaxis, hematuria or hematochezia.   I have reviewed the past medical history, past surgical history, social history and family history with the patient and they are unchanged from previous note.  ALLERGIES:  has No Known Allergies.  MEDICATIONS:  Current Outpatient Prescriptions  Medication Sig Dispense Refill  . Calcium Carb-Cholecalciferol (CALCIUM + D3 PO) Take 1 tablet by mouth daily. Calcium D-gluconate    . calcium carbonate (TUMS - DOSED IN MG ELEMENTAL CALCIUM) 500 MG chewable tablet Chew 1 tablet by mouth 2 (two) times daily.      . Ferrous Sulfate (IRON) 28 MG TABS Take 1 tablet by mouth 2 (two) times daily.     . polycarbophil (FIBERCON) 625 MG tablet Take 1 tablet by mouth 2 (two) times daily.      . vitamin B-12 (CYANOCOBALAMIN) 500 MCG tablet Take 500 mcg by mouth every other day.      . Vitamin D, Ergocalciferol, (DRISDOL) 50000 UNITS CAPS Take 50,000 Units by mouth Once a week.    . warfarin (COUMADIN) 5 MG tablet Take 1 tablet (5 mg total) by mouth as directed. 270 tablet 3  . zinc gluconate 50 MG tablet Take 50 mg by mouth daily.       No current facility-administered medications for this visit.     REVIEW OF SYSTEMS:   Constitutional: Denies fevers, chills or night sweats Eyes: Denies blurriness of vision Ears, nose, mouth,  throat, and face: Denies mucositis or sore throat Respiratory: Denies cough, dyspnea or wheezes Cardiovascular: Denies palpitation, chest discomfort or lower extremity swelling Gastrointestinal:  Denies nausea, heartburn or change in bowel habits Skin: Denies abnormal skin rashes Lymphatics: Denies new lymphadenopathy or easy bruising Neurological:Denies numbness, tingling or new weaknesses Behavioral/Psych: Mood is stable, no new changes  All other systems were reviewed with the patient and are negative.  PHYSICAL EXAMINATION: ECOG PERFORMANCE STATUS: 0 - Asymptomatic  Filed Vitals:   05/07/14 0904  BP: 132/71  Pulse: 69  Temp: 98 F (36.7 C)  Resp: 19   Filed Weights   05/07/14 0904  Weight: 305 lb 12.8 oz (138.71 kg)    GENERAL:alert, no distress and comfortable SKIN: skin color, texture, turgor are normal, no rashes or significant lesions EYES: normal, Conjunctiva are pink and non-injected, sclera clear OROPHARYNX:no exudate, no erythema and lips, buccal mucosa, and tongue normal  NECK: supple, thyroid normal size, non-tender, without nodularity LYMPH:  no palpable lymphadenopathy in the cervical, axillary or inguinal LUNGS: clear to auscultation and percussion with normal breathing effort HEART: regular rate & rhythm and no murmurs and no lower extremity edema ABDOMEN:abdomen soft, non-tender and normal bowel sounds Musculoskeletal:no cyanosis of digits and no clubbing  NEURO: alert & oriented x 3 with fluent speech, no focal motor/sensory deficits  LABORATORY DATA:  I have reviewed the data as listed Results for orders placed or performed in visit on 05/07/14 (from the past 48  hour(s))  POCT INR     Status: None   Collection Time: 05/07/14 12:00 AM  Result Value Ref Range   INR 3.4     Lab Results  Component Value Date   WBC 5.4 05/07/2014   HGB 14.1 05/07/2014   HCT 43.3 05/07/2014   MCV 91.3 05/07/2014   PLT 258 05/07/2014   15 minutesASSESSMENT & PLAN:   DVT, bilateral lower limbs She had no clinical signs of recurrence. The patient is a case for lifelong anticoagulation therapy, goal INR 2-3. We discussed possibility of switch her to [place]. She is comfortable to remain on warfarin. The patient disclosed to me that she might be moving closer to [facility] within a few months.  She may not return in the future. She would need a PCP for lifelong anticoagulation management after she moved and she is aware of the plan.    All questions were answered. The patient knows to call the clinic with any problems, questions or concerns. No barriers to learning was detected.  I spent 15 minutes counseling the patient face to face. The total time spent in the appointment was 20 minutes and more than 50% was on counseling.     [facility], Saamir Armstrong, MD 4/4/20169:18 AM

## 2014-05-07 NOTE — Telephone Encounter (Signed)
gave and printed appt sched and avs fo rpt for April 2016 °

## 2014-05-07 NOTE — Progress Notes (Signed)
INR = 3.4 on Coumadin 12.5 mg daily No complaints today re: anticoag. Seeing Dr. Alvy Bimler today. INR at goal.  No change necessary to Coumadin dose. Return in ~ 8 weeks. Kennith Center, Pharm.D., CPP 05/07/2014@9 :05 AM

## 2014-05-07 NOTE — Assessment & Plan Note (Signed)
She had no clinical signs of recurrence. The patient is a case for lifelong anticoagulation therapy, goal INR 2-3. We discussed possibility of switch her to [place]. She is comfortable to remain on warfarin. The patient disclosed to me that she might be moving closer to [facility] within a few months.  She may not return in the future. She would need a PCP for lifelong anticoagulation management after she moved and she is aware of the plan.

## 2014-05-09 ENCOUNTER — Ambulatory Visit: Payer: 59 | Admitting: Hematology and Oncology

## 2014-05-09 ENCOUNTER — Other Ambulatory Visit: Payer: 59

## 2014-06-04 ENCOUNTER — Encounter: Payer: Self-pay | Admitting: *Deleted

## 2014-06-04 NOTE — Progress Notes (Signed)
Pt scheduled for Colonoscopy on 06/26/14.  Faxed letter to Dr. Penelope Coop from Dr. Alvy Bimler states ok for pt to hold Coumadin for 5 days prior to colonoscopy and resume same day after biopsy.

## 2014-06-18 ENCOUNTER — Encounter: Payer: Self-pay | Admitting: *Deleted

## 2014-06-18 NOTE — Progress Notes (Signed)
Received fax from [facility], pt has colonoscopy scheduled by Dr. Penelope Coop for 5/24.  They want Dr. Alvy Bimler to sign ok to hold coumadin x 5 days prior to procedure, starting on 5/19.  Dr. Alvy Bimler agrees and signed the form.  I faxed it back to [facility] at fax [phone].

## 2014-07-09 ENCOUNTER — Encounter: Payer: Self-pay | Admitting: Hematology and Oncology

## 2014-07-09 ENCOUNTER — Other Ambulatory Visit (HOSPITAL_BASED_OUTPATIENT_CLINIC_OR_DEPARTMENT_OTHER): Payer: Managed Care, Other (non HMO)

## 2014-07-09 ENCOUNTER — Ambulatory Visit (HOSPITAL_BASED_OUTPATIENT_CLINIC_OR_DEPARTMENT_OTHER): Payer: Self-pay | Admitting: Pharmacist

## 2014-07-09 DIAGNOSIS — I82403 Acute embolism and thrombosis of unspecified deep veins of lower extremity, bilateral: Secondary | ICD-10-CM

## 2014-07-09 DIAGNOSIS — I82401 Acute embolism and thrombosis of unspecified deep veins of right lower extremity: Secondary | ICD-10-CM

## 2014-07-09 DIAGNOSIS — I82409 Acute embolism and thrombosis of unspecified deep veins of unspecified lower extremity: Secondary | ICD-10-CM

## 2014-07-09 LAB — PROTIME-INR
INR: 2.4 (ref 2.00–3.50)
PROTIME: 28.8 s — AB (ref 10.6–13.4)

## 2014-07-09 LAB — POCT INR: INR: 2.4

## 2014-07-09 MED ORDER — WARFARIN SODIUM 5 MG PO TABS: ORAL_TABLET | ORAL | Status: AC

## 2014-07-09 NOTE — Progress Notes (Signed)
NO CHARGE COUMADIN CLINIC PATIENT  Pt was seen in clinic today INr=2.4 on 12.5 mg daily She had a colonoscopy on 5.24.16 and was off coumadin for 5 days then resumed at normal dose No med changes Diet consistent Pt is moving to [place] and plans to seek medical care closer to [place] area She request 8.15.16 appmt as she has a vet appmt for her cats on this day in [place] to her new mail order [facility] (called to confirm rx to [place] location)  Continue coumadin 12.5mg  daily.  Recheck INR on 09/17/14: Lab at 3:30 pm and coumadin clinic at 3:45 pm

## 2014-07-09 NOTE — Patient Instructions (Signed)
Continue coumadin 12.5mg  daily.  Recheck INR on 09/17/14: Lab at 3:30 pm and coumadin clinic at 3:45 pm

## 2014-07-10 ENCOUNTER — Encounter: Payer: Self-pay | Admitting: *Deleted

## 2014-09-14 ENCOUNTER — Telehealth: Payer: Self-pay | Admitting: Hematology and Oncology

## 2014-09-14 ENCOUNTER — Other Ambulatory Visit: Payer: Self-pay | Admitting: Hematology and Oncology

## 2014-09-14 NOTE — Telephone Encounter (Signed)
Left message to confirm appointment for October.

## 2014-09-17 ENCOUNTER — Ambulatory Visit: Payer: Self-pay | Admitting: Pharmacist

## 2014-09-17 ENCOUNTER — Other Ambulatory Visit (HOSPITAL_BASED_OUTPATIENT_CLINIC_OR_DEPARTMENT_OTHER): Payer: Managed Care, Other (non HMO)

## 2014-09-17 DIAGNOSIS — I82403 Acute embolism and thrombosis of unspecified deep veins of lower extremity, bilateral: Secondary | ICD-10-CM

## 2014-09-17 DIAGNOSIS — I82409 Acute embolism and thrombosis of unspecified deep veins of unspecified lower extremity: Secondary | ICD-10-CM

## 2014-09-17 LAB — CBC WITH DIFFERENTIAL/PLATELET
BASO%: 1.2 % (ref 0.0–2.0)
BASOS ABS: 0.1 10*3/uL (ref 0.0–0.1)
EOS ABS: 0.1 10*3/uL (ref 0.0–0.5)
EOS%: 1.7 % (ref 0.0–7.0)
HEMATOCRIT: 42.6 % (ref 34.8–46.6)
HEMOGLOBIN: 14 g/dL (ref 11.6–15.9)
LYMPH#: 2.3 10*3/uL (ref 0.9–3.3)
LYMPH%: 27.9 % (ref 14.0–49.7)
MCH: 29.8 pg (ref 25.1–34.0)
MCHC: 32.9 g/dL (ref 31.5–36.0)
MCV: 90.5 fL (ref 79.5–101.0)
MONO#: 0.7 10*3/uL (ref 0.1–0.9)
MONO%: 8.4 % (ref 0.0–14.0)
NEUT#: 5.1 10*3/uL (ref 1.5–6.5)
NEUT%: 60.8 % (ref 38.4–76.8)
PLATELETS: 273 10*3/uL (ref 145–400)
RBC: 4.71 10*6/uL (ref 3.70–5.45)
RDW: 13.5 % (ref 11.2–14.5)
WBC: 8.4 10*3/uL (ref 3.9–10.3)

## 2014-09-17 LAB — COMPREHENSIVE METABOLIC PANEL (CC13)
ALBUMIN: 3.7 g/dL (ref 3.5–5.0)
ALK PHOS: 136 U/L (ref 40–150)
ALT: 26 U/L (ref 0–55)
AST: 20 U/L (ref 5–34)
Anion Gap: 10 mEq/L (ref 3–11)
BILIRUBIN TOTAL: 0.78 mg/dL (ref 0.20–1.20)
BUN: 12.6 mg/dL (ref 7.0–26.0)
CO2: 26 mEq/L (ref 22–29)
Calcium: 9.3 mg/dL (ref 8.4–10.4)
Chloride: 104 mEq/L (ref 98–109)
Creatinine: 0.8 mg/dL (ref 0.6–1.1)
EGFR: 81 mL/min/{1.73_m2} — AB (ref >90)
GLUCOSE: 106 mg/dL (ref 70–140)
Potassium: 4.3 mEq/L (ref 3.5–5.1)
Sodium: 140 mEq/L (ref 136–145)
TOTAL PROTEIN: 6.9 g/dL (ref 6.4–8.3)

## 2014-09-17 LAB — POCT INR: INR: 2.4

## 2014-09-17 LAB — PROTIME-INR
INR: 2.4 (ref 2.00–3.50)
Protime: 28.8 Seconds — ABNORMAL HIGH (ref 10.6–13.4)

## 2014-09-17 NOTE — Progress Notes (Signed)
NO CHARGE Pt FINAL [facility] COUMADIN CLINIC VISIT  INR = 2.4 on Coumadin 12.5 mg daily CBC wnl today. No complaints re: anticoag.   She has usual swelling in legs when traveling long distances.  Uses compression stockings.  She has occasional leg cramps. INR essentially at goal and is asymptomatic.  She is ok w/ [place] continuing same Coumadin dose. She is going to follow up from here on w/ Dr. Alvy Bimler or new MD once she moves to [place], [place]. Kennith Center, Pharm.D., CPP 09/17/2014@4 :33 PM

## 2014-09-18 ENCOUNTER — Telehealth: Payer: Self-pay | Admitting: Pharmacist

## 2014-09-18 ENCOUNTER — Telehealth: Payer: Self-pay | Admitting: *Deleted

## 2014-09-18 NOTE — Telephone Encounter (Signed)
-----   Message from Heath Lark, MD sent at 09/17/2014  4:36 PM EDT ----- Regarding: RE: Scheduled appt [name],  Please call the patient and clarify. The appt in October is for further discussion regarding anticoagulation Rx after coumadin clinic closes on 9/30 Please advise her to keep the appointment unless she can get INR monitoring through her PCP then she can cancel the appt ----- Message -----    From: Tora Kindred, [facility]    Sent: 09/17/2014   4:17 PM      To: Heath Lark, MD Subject: Scheduled appt                                 Rachel Yoder (MRN [id number]) saw me in Coumadin clinic today. She is confused why she has a scheduled appt for 11/16/14 at 9:30 am lab; 10 am MD visit. She is under impression she sees you on a yearly basis and you last saw her April 2016. Please clarify.  And if she's not supposed to come in Oct, can you have someone call her & cancel the appt? She will need to continue labs for her Coumadin management once our clinic closes. Thanks! Vergia Alcon

## 2014-09-18 NOTE — Telephone Encounter (Signed)
Pt will have PCP monitor INR

## 2014-09-18 NOTE — Telephone Encounter (Signed)
Pt had CBC & CMET drawn yesterday & pt not sure why. I informed pt I had s/w lab personnel and they will be contacting Dr. Alvy Bimler to see if she wanted these labs drawn yesterday (pt is sched to see MD in Oct). The labs will then need to be credited if MD did not want them drawn/if labs drawn in error.  Pt aware a RN may be calling her back about this.  Note: pt stated she s/w someone this AM about 11/16/14 appts and she cancelled these appts & will be contacting her PCP for labwork. Kennith Center, Pharm.D., CPP 09/18/2014@9 :20 AM

## 2014-10-09 ENCOUNTER — Telehealth: Payer: Self-pay | Admitting: *Deleted

## 2014-10-09 NOTE — Telephone Encounter (Signed)
PT. STATES PER [place] SHE SHOULD ONLY BE CHARGED FOR THE LAB WORK NEEDED FOR THE COUMADIN CLINIC. NOTIFIED LAB MANAGER.

## 2014-10-12 NOTE — Telephone Encounter (Signed)
VOICE MAIL AT 11:10AM SPOKE TO KELLY JAMES, LAB MANAGER. A CREDIT FOR LAB DRAWN IN ERROR WAS SENT TO BILLING ON 09/19/14 AND AGAIN ON 10/09/14. NOTIFIED PT. OF THE ABOVE INFORMATION. INSTRUCTED PT. TO CALL THE BILLING DEPARTMENT AND SEE IF THE CREDIT HAS BEEN RECEIVED. PT. VOICES UNDERSTANDING.

## 2014-10-22 ENCOUNTER — Other Ambulatory Visit: Payer: Self-pay

## 2014-10-22 DIAGNOSIS — Z1231 Encounter for screening mammogram for malignant neoplasm of breast: Secondary | ICD-10-CM

## 2014-11-16 ENCOUNTER — Other Ambulatory Visit: Payer: Managed Care, Other (non HMO)

## 2014-11-16 ENCOUNTER — Ambulatory Visit: Payer: Managed Care, Other (non HMO) | Admitting: Hematology and Oncology

## 2014-11-19 ENCOUNTER — Encounter: Payer: Self-pay | Admitting: Hematology and Oncology

## 2014-11-20 ENCOUNTER — Encounter: Payer: Self-pay | Admitting: *Deleted

## 2014-11-26 ENCOUNTER — Ambulatory Visit
Admission: RE | Admit: 2014-11-26 | Discharge: 2014-11-26 | Disposition: A | Discharge status: Home / Self Care | Payer: Managed Care, Other (non HMO) | Source: Ambulatory Visit

## 2014-11-26 DIAGNOSIS — Z1231 Encounter for screening mammogram for malignant neoplasm of breast: Secondary | ICD-10-CM

## 2015-05-01 ENCOUNTER — Telehealth: Payer: Self-pay | Admitting: *Deleted

## 2015-05-01 NOTE — Telephone Encounter (Signed)
PLs cancel her appt to see me No need to see a hematologist since she is seeing a PCP who manages her INR

## 2015-05-01 NOTE — Telephone Encounter (Signed)
-----   Message from Heath Lark, MD sent at 05/01/2015  7:56 AM EDT ----- From our last visit she said she was moving Can you call? If she had moved, we can cancel her appt next Monday

## 2015-05-01 NOTE — Telephone Encounter (Signed)
Pt states she has moved to [place].  Her PCP here,  Dr. Inda Merlin, is still managing her coumadin and everything has been stable w/ that.  She has not seen a need to get a new Hematologist in [place] since there is "nothing new" happening. She wonders if she really needs to keep appt w/ Dr. Alvy Bimler and if she needs to get new Hematologist since her PCP is managing her coumadin now?

## 2015-05-01 NOTE — Telephone Encounter (Signed)
Informed pt of appts cancelled.  She will continue to follow w/ her PCP and either call us back or get new Hematologist in future if her PCP thinks it is necessary.

## 2015-05-06 ENCOUNTER — Other Ambulatory Visit: Payer: Managed Care, Other (non HMO)

## 2015-05-06 ENCOUNTER — Ambulatory Visit: Payer: Managed Care, Other (non HMO) | Admitting: Hematology and Oncology

## 2019-07-19 ENCOUNTER — Telehealth: Payer: Self-pay | Admitting: Orthopaedic Surgery

## 2019-07-19 NOTE — Telephone Encounter (Signed)
[facility] called. Needs to know if patient needs pre-meds before dental work. Callback [phone]

## 2019-07-19 NOTE — Telephone Encounter (Signed)
They are aware patient had surgery in 2012, she no longer needs pre-med at this time
